# Patient Record
Sex: Female | Born: 1935 | Race: White | Hispanic: No | Smoking: Never smoker
Health system: Southern US, Community
[De-identification: ages and names within clinical notes are randomized; demographics above are authoritative.]

## PROBLEM LIST (undated history)

## (undated) DIAGNOSIS — M1990 Unspecified osteoarthritis, unspecified site: Secondary | ICD-10-CM

## (undated) DIAGNOSIS — F32A Depression, unspecified: Secondary | ICD-10-CM

## (undated) DIAGNOSIS — K529 Noninfective gastroenteritis and colitis, unspecified: Secondary | ICD-10-CM

## (undated) DIAGNOSIS — G894 Chronic pain syndrome: Secondary | ICD-10-CM

## (undated) DIAGNOSIS — M797 Fibromyalgia: Secondary | ICD-10-CM

## (undated) DIAGNOSIS — F0390 Unspecified dementia without behavioral disturbance: Secondary | ICD-10-CM

## (undated) DIAGNOSIS — F329 Major depressive disorder, single episode, unspecified: Secondary | ICD-10-CM

## (undated) DIAGNOSIS — I10 Essential (primary) hypertension: Secondary | ICD-10-CM

## (undated) DIAGNOSIS — K222 Esophageal obstruction: Secondary | ICD-10-CM

## (undated) DIAGNOSIS — G8929 Other chronic pain: Secondary | ICD-10-CM

## (undated) DIAGNOSIS — M549 Dorsalgia, unspecified: Secondary | ICD-10-CM

## (undated) DIAGNOSIS — F419 Anxiety disorder, unspecified: Secondary | ICD-10-CM

## (undated) DIAGNOSIS — M810 Age-related osteoporosis without current pathological fracture: Secondary | ICD-10-CM

## (undated) DIAGNOSIS — J189 Pneumonia, unspecified organism: Secondary | ICD-10-CM

## (undated) DIAGNOSIS — N390 Urinary tract infection, site not specified: Secondary | ICD-10-CM

## (undated) DIAGNOSIS — C4490 Unspecified malignant neoplasm of skin, unspecified: Secondary | ICD-10-CM

## (undated) HISTORY — PX: FRACTURE SURGERY: SHX138

## (undated) HISTORY — PX: CHOLECYSTECTOMY: SHX55

## (undated) HISTORY — PX: POSTERIOR LUMBAR FUSION: SHX6036

## (undated) HISTORY — DX: Major depressive disorder, single episode, unspecified: F32.9

## (undated) HISTORY — PX: BACK SURGERY: SHX140

## (undated) HISTORY — DX: Noninfective gastroenteritis and colitis, unspecified: K52.9

## (undated) HISTORY — PX: ESOPHAGOGASTRODUODENOSCOPY (EGD) WITH ESOPHAGEAL DILATION: SHX5812

## (undated) HISTORY — PX: APPENDECTOMY: SHX54

## (undated) HISTORY — PX: SKIN CANCER EXCISION: SHX779

## (undated) HISTORY — PX: VEIN LIGATION AND STRIPPING: SHX2653

## (undated) HISTORY — DX: Chronic pain syndrome: G89.4

## (undated) HISTORY — PX: ABDOMINAL HYSTERECTOMY: SHX81

## (undated) HISTORY — PX: CATARACT EXTRACTION W/ INTRAOCULAR LENS  IMPLANT, BILATERAL: SHX1307

## (undated) HISTORY — DX: Depression, unspecified: F32.A

## (undated) HISTORY — PX: COLONOSCOPY: SHX174

## (undated) HISTORY — PX: TUBAL LIGATION: SHX77

---

## 1998-10-11 ENCOUNTER — Ambulatory Visit (HOSPITAL_COMMUNITY): Admission: RE | Admit: 1998-10-11 | Discharge: 1998-10-11 | Payer: Self-pay | Admitting: Family Medicine

## 1998-10-11 ENCOUNTER — Encounter: Payer: Self-pay | Admitting: Family Medicine

## 1998-11-19 ENCOUNTER — Encounter: Payer: Self-pay | Admitting: Emergency Medicine

## 1998-11-19 ENCOUNTER — Inpatient Hospital Stay (HOSPITAL_COMMUNITY): Admission: EM | Admit: 1998-11-19 | Discharge: 1998-11-21 | Payer: Self-pay | Admitting: Emergency Medicine

## 1998-11-26 ENCOUNTER — Encounter: Payer: Self-pay | Admitting: Cardiology

## 1998-11-26 ENCOUNTER — Ambulatory Visit (HOSPITAL_COMMUNITY): Admission: RE | Admit: 1998-11-26 | Discharge: 1998-11-26 | Payer: Self-pay | Admitting: Cardiology

## 2000-07-30 ENCOUNTER — Ambulatory Visit (HOSPITAL_COMMUNITY): Admission: RE | Admit: 2000-07-30 | Discharge: 2000-07-30 | Payer: Self-pay | Admitting: Family Medicine

## 2000-07-30 ENCOUNTER — Encounter: Payer: Self-pay | Admitting: Family Medicine

## 2000-11-12 ENCOUNTER — Inpatient Hospital Stay
Admission: RE | Admit: 2000-11-12 | Discharge: 2000-11-18 | Payer: Self-pay | Admitting: Physical Medicine & Rehabilitation

## 2000-12-14 ENCOUNTER — Ambulatory Visit (HOSPITAL_COMMUNITY): Admission: RE | Admit: 2000-12-14 | Discharge: 2000-12-14 | Payer: Self-pay | Admitting: Neurosurgery

## 2000-12-14 ENCOUNTER — Encounter: Payer: Self-pay | Admitting: Neurosurgery

## 2001-03-19 ENCOUNTER — Encounter: Payer: Self-pay | Admitting: Neurosurgery

## 2001-03-19 ENCOUNTER — Ambulatory Visit (HOSPITAL_COMMUNITY): Admission: RE | Admit: 2001-03-19 | Discharge: 2001-03-19 | Payer: Self-pay | Admitting: Neurosurgery

## 2001-03-19 ENCOUNTER — Encounter: Admission: RE | Admit: 2001-03-19 | Discharge: 2001-03-19 | Payer: Self-pay | Admitting: Family Medicine

## 2001-03-19 ENCOUNTER — Encounter: Payer: Self-pay | Admitting: Family Medicine

## 2003-10-31 ENCOUNTER — Ambulatory Visit (HOSPITAL_COMMUNITY): Admission: RE | Admit: 2003-10-31 | Discharge: 2003-10-31 | Payer: Self-pay | Admitting: Neurosurgery

## 2003-11-02 ENCOUNTER — Encounter: Admission: RE | Admit: 2003-11-02 | Discharge: 2003-11-02 | Payer: Self-pay | Admitting: Neurosurgery

## 2003-12-26 ENCOUNTER — Ambulatory Visit (HOSPITAL_COMMUNITY): Admission: RE | Admit: 2003-12-26 | Discharge: 2003-12-26 | Payer: Self-pay | Admitting: Orthopedic Surgery

## 2004-03-12 ENCOUNTER — Inpatient Hospital Stay (HOSPITAL_COMMUNITY): Admission: RE | Admit: 2004-03-12 | Discharge: 2004-03-16 | Payer: Self-pay | Admitting: Orthopedic Surgery

## 2004-03-12 HISTORY — PX: HEMIARTHROPLASTY SHOULDER FRACTURE: SUR653

## 2004-03-28 ENCOUNTER — Emergency Department (HOSPITAL_COMMUNITY): Admission: EM | Admit: 2004-03-28 | Discharge: 2004-03-29 | Payer: Self-pay | Admitting: Emergency Medicine

## 2004-04-05 ENCOUNTER — Observation Stay (HOSPITAL_COMMUNITY): Admission: AD | Admit: 2004-04-05 | Discharge: 2004-04-07 | Payer: Self-pay | Admitting: Gastroenterology

## 2004-08-14 ENCOUNTER — Encounter (INDEPENDENT_AMBULATORY_CARE_PROVIDER_SITE_OTHER): Payer: Self-pay | Admitting: Specialist

## 2004-08-14 ENCOUNTER — Observation Stay (HOSPITAL_COMMUNITY): Admission: RE | Admit: 2004-08-14 | Discharge: 2004-08-15 | Payer: Self-pay | Admitting: General Surgery

## 2005-06-25 ENCOUNTER — Ambulatory Visit (HOSPITAL_COMMUNITY): Admission: RE | Admit: 2005-06-25 | Discharge: 2005-06-25 | Payer: Self-pay | Admitting: Gastroenterology

## 2007-11-30 HISTORY — PX: CLOSED REDUCTION NASAL FRACTURE: SUR256

## 2007-12-06 ENCOUNTER — Emergency Department (HOSPITAL_COMMUNITY): Admission: EM | Admit: 2007-12-06 | Discharge: 2007-12-06 | Payer: Self-pay | Admitting: Emergency Medicine

## 2007-12-14 ENCOUNTER — Encounter: Admission: RE | Admit: 2007-12-14 | Discharge: 2007-12-14 | Payer: Self-pay | Admitting: Otolaryngology

## 2007-12-16 ENCOUNTER — Ambulatory Visit (HOSPITAL_BASED_OUTPATIENT_CLINIC_OR_DEPARTMENT_OTHER): Admission: RE | Admit: 2007-12-16 | Discharge: 2007-12-16 | Payer: Self-pay | Admitting: Otolaryngology

## 2008-09-06 ENCOUNTER — Ambulatory Visit: Payer: Self-pay | Admitting: Vascular Surgery

## 2008-10-18 ENCOUNTER — Encounter: Admission: RE | Admit: 2008-10-18 | Discharge: 2008-10-18 | Payer: Self-pay | Admitting: Family Medicine

## 2009-03-29 ENCOUNTER — Ambulatory Visit (HOSPITAL_COMMUNITY): Admission: RE | Admit: 2009-03-29 | Discharge: 2009-03-29 | Payer: Self-pay | Admitting: Gastroenterology

## 2009-11-14 ENCOUNTER — Ambulatory Visit: Payer: Self-pay | Admitting: Cardiology

## 2009-11-19 ENCOUNTER — Telehealth (INDEPENDENT_AMBULATORY_CARE_PROVIDER_SITE_OTHER): Payer: Self-pay | Admitting: *Deleted

## 2009-11-20 ENCOUNTER — Encounter: Payer: Self-pay | Admitting: Cardiovascular Disease

## 2009-11-20 ENCOUNTER — Encounter (HOSPITAL_COMMUNITY): Admission: RE | Admit: 2009-11-20 | Discharge: 2009-12-18 | Payer: Self-pay | Admitting: Cardiology

## 2009-11-20 ENCOUNTER — Encounter (INDEPENDENT_AMBULATORY_CARE_PROVIDER_SITE_OTHER): Payer: Self-pay | Admitting: *Deleted

## 2009-11-20 ENCOUNTER — Ambulatory Visit: Payer: Self-pay

## 2009-11-20 ENCOUNTER — Ambulatory Visit: Payer: Self-pay | Admitting: Cardiovascular Disease

## 2009-11-20 HISTORY — PX: CARDIOVASCULAR STRESS TEST: SHX262

## 2009-11-28 ENCOUNTER — Ambulatory Visit: Payer: Self-pay | Admitting: Cardiology

## 2010-05-02 NOTE — Letter (Signed)
Summary: Outpatient Coinsurance Notice  Outpatient Coinsurance Notice   Imported By: Marylou Mccoy 11/30/2009 09:41:54  _____________________________________________________________________  External Attachment:    Type:   Image     Comment:   External Document

## 2010-05-02 NOTE — Assessment & Plan Note (Signed)
Summary: Cardiology Nuclear Testing  Nuclear Med Background Indications for Stress Test: Evaluation for Ischemia   History: GXT  History Comments: NO DOCUMENTED CAD  Symptoms: Chest Pressure, Diaphoresis, Dizziness, DOE, Fatigue, Near Syncope, SOB  Symptoms Comments: c/o neck pressure. Last episode of CP:1 week ago.   Nuclear Pre-Procedure Cardiac Risk Factors: Family History - CAD, Hypertension, Overweight Caffeine/Decaff Intake: none NPO After: 10:00 PM Lungs: Clear.  O2 Sat 98% on RA. IV 0.9% NS with Angio Cath: 22g     IV Site: R Hand IV Started by: Cathlyn Parsons, RN Cup Size 42C     Height (in): 65 Weight (lb): 174 BMI: 29.06  Nuclear Med Study 1 or 2 day study:  1 day     Stress Test Type:  Treadmill/Lexiscan Reading MD:  Charlton Haws, MD     Referring MD:  Roger Shelter, MD Resting Radionuclide:  Technetium 22m Tetrofosmin     Resting Radionuclide Dose:  10.7 mCi  Stress Radionuclide:  Technetium 28m Tetrofosmin     Stress Radionuclide Dose:  33.0 mCi   Stress Protocol Exercise Time (min):  2:00 min     Max HR:  101 bpm     Predicted Max HR:  143 bpm       Percent Max HR:  70.63 % Lexiscan: 0.4 mg   Stress Test Technologist:  [employer], CMA-N     Nuclear Technologist:  Domenic Polite, CNMT  Rest Procedure  Myocardial perfusion imaging was performed at rest 45 minutes following the intravenous administration of Technetium 51m Tetrofosmin.  Stress Procedure  The patient received IV Lexiscan 0.4 mg over 15-seconds with concurrent low level exercise and then technetium 39m Tetrofosmin was injected at 30-seconds while  the patient continued walking one more minute.  There were no significant changes with infusion.  She did c/o chest pressure with infusion.  Quantitative spect images were obtained after a 45 minute delay.  QPS Raw Data Images:  Normal; no motion artifact; normal heart/lung ratio. Stress Images:  Normal homogeneous uptake in all areas of the  myocardium. Rest Images:  Normal homogeneous uptake in all areas of the myocardium. Subtraction (SDS):  Normal Transient Ischemic Dilatation:  0.94  (Normal <1.22)  Lung/Heart Ratio:  0.38  (Normal <0.45)  Quantitative Gated Spect Images QGS EDV:  41 ml QGS ESV:  10 ml QGS EF:  76 % QGS cine images:  normal  Findings Normal nuclear study      Overall Impression  Exercise Capacity: Lexiscan with no exercise. BP Response: Normal blood pressure response. Clinical Symptoms: Dyspnea ECG Impression: No significant ST segment change suggestive of ischemia. Overall Impression: Normal stress nuclear study. Overall Impression Comments: Normal

## 2010-05-02 NOTE — Progress Notes (Signed)
Summary: nuclear pre procdure  Phone Note Outgoing Call Call back at [facility] Phone [phone]   Call placed by: Cathlyn Parsons RN,  November 19, 2009 3:54 PM Call placed to: Patient Summary of Call: Reviewed information on My[employer] Information Sheet (see scanned document for further details).  Spoke with pt.      Nuclear Med Background Indications for Stress Test: Evaluation for Ischemia     Symptoms: Chest Pressure, Diaphoresis, Dizziness

## 2010-08-13 NOTE — Op Note (Signed)
Alicia Roth, MILLION                ACCOUNT NO.:  [id number]   MEDICAL RECORD NO.:  [id number]          PATIENT TYPE:  AMB   LOCATION:  DSC                          FACILITY:  [facility]   PHYSICIAN:  Antony Contras, MD     DATE OF BIRTH:  12-15-1930   DATE OF PROCEDURE:  12/16/2007  DATE OF DISCHARGE:                               OPERATIVE REPORT   PREOPERATIVE DIAGNOSIS:  Displaced nasal fracture.   POSTOPERATIVE DIAGNOSIS:  Displaced nasal fracture.   PROCEDURE:  Closed nasal reduction.   SURGEON:  Excell Seltzer. Jenne Pane, MD.   ANESTHESIA:  General LMA.   COMPLICATIONS:  None.   INDICATION:  The patient is a 77 year old white female who fell about 10  days ago, striking her face on the pavement.  She sustained nasal  fractures with the right side being displaced with a step-off palpated.  She presents to the operating room for surgical management.   FINDINGS:  As above.   DESCRIPTION OF THE PROCEDURE:  The patient was identified in the holding  room and informed consent having been obtained including discussion of  risks, benefits, and alternatives, the patient was brought to the  operative suite and put on the operative table in supine position.  Anesthesia was induced and LMA was placed without difficulty.  The  patient was given intravenous steroids during the case.  The eyes were  taped closed and Afrin pledgets were placed in both sides of the nose  for several minutes.  Pledgets removed and a Goldman elevator was then  inserted first in the right nasal passage and then into the left with  bimanual manipulation to reduce the fracture.  With fractures in line in  the nose in the midline position, Benzoin was placed in the outer skin  followed by Steri-Strips.  A thermoplastic splint was then put into hot  water and made malleable and laid over the nose until it hardened.  The  nasal passages were suctioned.  The patient was returned back to  anesthesia for wake up.  She was  extubated and moved to the recovery  room in stable condition.      Antony Contras, MD  Electronically Signed     DDB/MEDQ  D:  12/16/2007  T:  12/17/2007  Job:  [id number]

## 2010-08-13 NOTE — Procedures (Signed)
LOWER EXTREMITY VENOUS REFLUX EXAM   INDICATION:  Bilateral lower extremity varicose vein with pain.  Right  lower extremity vein stripping in 1960s.   EXAM:  Using color-flow imaging and pulse Doppler spectral analysis, the  right and left common femoral, superficial femoral, popliteal, posterior  tibial, greater and lesser saphenous veins are evaluated.  There is  evidence suggesting deep venous insufficiency in the left lower  extremity, the right is within normal limits.   The right and left saphenofemoral junctions are competent.  The left GSV  is not competent with the caliber as described below.  The right GSV  appears to have been stripped, however, large tortuous vein from  proximal/mid thigh to distal calf was noted.   The right and left proximal short saphenous veins demonstrates  competency.   GSV Diameter (used if found to be incompetent only)                                            Right    Left  Proximal Greater Saphenous Vein           cm       0.41 cm  Proximal-to-mid-thigh                     cm       cm  Mid thigh                                 cm       0.46 cm  Mid-distal thigh                          cm       cm  Distal thigh                              cm       0.38 cm  Knee                                      cm       0.41 cm   IMPRESSION:  1. Left greater saphenous vein reflux is identified with the caliber      ranging from 0.38 cm to 0.46 cm knee to groin.  2. The right GSV appears to have been stripped, however, large      tortuous vein from proximal/mid thigh to distal calf was noted.  3. The left greater saphenous vein is not aneurysmal.  4. The left greater saphenous vein is not tortuous.  5. The deep venous system is not competent on the left, the right is      within normal limits.  6. The right and left lesser saphenous veins are competent.       ___________________________________________  Larina Earthly, M.D.   AS/MEDQ  D:  09/06/2008  T:  09/06/2008  Job:  [id number]

## 2010-08-13 NOTE — Consult Note (Signed)
NEW PATIENT CONSULTATION   Alicia Roth, Alicia Roth  DOB:  01/05/1931                                       09/06/2008  CHART#:[id number]   The patient presents today for evaluation of venous pathology.  She is a  77 year old today.  She has multiple complaints and reports that she has  pain from the top of her head to the bottoms of her feet.  She reports  that she has chronic pain in her lower extremities, mainly extending  from her lower back down into both legs, more so on the left leg than on  the right leg.  She does not have any history of DVT.  Does have a  history of right great saphenous vein stripping in 1964.  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  She has had multiple difficulties with arthritis, and has had  multiple prior back surgeries.  She is seen today to determine if this  is any correctable pathology in her legs and may improve her pain.  She  does not have any major medical difficulties, specifically cardiac  issues or peripheral vascular disease.  She does have a history of  premature atherosclerotic disease in her mother.   SOCIAL HISTORY:  She is widowed with 4 children.  She is retired.  She  does not smoke, or drink alcohol.   REVIEW OF SYSTEMS:  Her weight is reported at 183 pounds.  She is 5 feet  4 inches tall.  She does have esophageal reflux and history of  dizziness, headaches, arthritis, joint pain, muscle pain.  She does have  a sensitivity to Indocin and Neurontin.   PHYSICAL EXAM:  Well-developed, well-nourished white female appearing  stated age of 31.  Her radial and dorsalis pulses are 2+ bilaterally.  She does have a significant amount of tributary varicosities in her  medial left thigh extending down onto her calf.  She does not have any  venous ulcers or changes with venous hypertension.  Her left leg is  noted for a few scattered telangiectasia and small varicosities.   She  underwent noninvasive vascular laboratory studies in our office and  this revealed reflux throughout her left greater saphenous vein.  She  does not have any evidence of DVT or deep venous incompetence.  On the  right leg, her saphenous vein is surgically absent.  She does have  multiple tributary varicosities throughout her left thigh and calf.  I  discussed this at length with the patient and her daughter and daughter-  in-law present.  I do not feel that she has any symptoms of any degree  that is related to her left leg, since the majority of her pain appears  to be neuropathic.  She does have some fullness in the tributary  varicosities in her right leg, but again, I feel these are a small  component of her discomfort.  I did explain the option of a stab  phlebectomy in the tributary varicosities in her right leg for relief of  symptoms.  She understands this and agrees that this is a small  component of her discomfort and does not wish to proceed with aggressive  treatment at this time.  I did explain the possible use of compression  garments, but feel it would be very unlikely  for her to do this due to  her other comorbidities with arthritis and she has worn compression in  the past without success.  She understands and will see [place] again on an  as needed basis.   Larina Earthly, Roth.D.  Electronically Signed   TFE/MEDQ  D:  09/06/2008  T:  09/06/2008  Job:  [id number]   cc:   G. Dorene Grebe, Roth.D.  Windle Guard, Roth.D.

## 2010-08-16 NOTE — Discharge Summary (Signed)
NAMEJESSICAMARIE, AMIRI                ACCOUNT NO.:  [id number]   MEDICAL RECORD NO.:  [id number]          PATIENT TYPE:  INP   LOCATION:  5041                         FACILITY:  [facility]   PHYSICIAN:  Burnard Bunting, M.D.    DATE OF BIRTH:  1931-03-06   DATE OF ADMISSION:  03/12/2004  DATE OF DISCHARGE:  03/16/2004                                 DISCHARGE SUMMARY   DISCHARGE DIAGNOSIS:  Right shoulder arthritis.   SECONDARY DIAGNOSES:  History of lumbar surgery and vein surgery, as well as  tubal ligation.   OPERATIONS AND CODEABLE PROCEDURES:  Right shoulder hemiarthroplasty  performed on March 12, 2004.   HOSPITAL COURSE:  Neka Bise is a 77 year old patient with right shoulder  arthritis.  She presents for right shoulder hemiarthroplasty.  This was  performed on March 12, 2004, without complications.  On postoperative day  #1, the patient was noted to have intact perfusion of the right hand with  intact EPL, FPL, interosseous and radial pulse.  She was mobilized out of  bed to chair.  X-rays show good fill of the canal with the prosthesis.  The  patient was started with pendulum exercises on postoperative day #3.  She  otherwise had an uneventful recovery.  UA was negative on the day of  discharge.   DISPOSITION:  The patient was discharged home on March 16, 2004, in good  condition.   FOLLOWUP:  She will follow up with me in a week for staple removal.   ACTIVITY:  She will continue in a sling until that time with no lifting of  the right arm.   WOUND CARE:  She will need to keep the incision dry.   DISCHARGE MEDICATIONS:  Discharge medications include the admission  medications plus Darvocet for pain and Robaxin as a muscle relaxer.      GSD/MEDQ  D:  05/22/2004  T:  05/22/2004  Job:  [id number]

## 2010-08-16 NOTE — H&P (Signed)
[name], Roth                ACCOUNT NO.:  [id number]   MEDICAL RECORD NO.:  [id number]          PATIENT TYPE:  AMB   LOCATION:  DAY                          FACILITY:  [facility]   PHYSICIAN:  Anselm Pancoast. Weatherly, M.D.DATE OF BIRTH:  Jul 05, 1930   DATE OF ADMISSION:  08/14/2004  DATE OF DISCHARGE:                                HISTORY & PHYSICAL   CHIEF COMPLAINT:  Gallbladder problem.   HISTORY OF PRESENT ILLNESS:  Alicia Roth is a 77 year old female that I saw  approximately two months ago when she was referred by Dr. Windle Guard for  nausea and the following history.  The patient was being evaluated for  basically weight loss and other type symptoms.  Her husband had died of  colon cancer about 6-7 years ago and she has never had much of an appetite  and avoids food.  Then had a shoulder operation and afterwards developed  problems with diarrhea and she was rehospitalized because of diarrhea and  continued weight loss.  She never was confirmed to have C. difficile colitis  but her symptoms improved.  She had some mildly elevated liver tests at one  time and on evaluation Dr. Evette Cristal and Dr. Windle Guard were her regular  physician and she was found to have gallstones but whether she was actually  having an acute gallbladder at any time was never thought to be the case.  She had lost a significant amount of weight but she has gained that back.  She is now having episodes of nausea and for this reason was referred to me  for a cholecystectomy.  The patient's weight loss that had been lost has  been regained and her studies now as far as her liver tests and CBC are all  normal.  I have recommended proceeding with a laparoscopic cholecystectomy  and cholangiogram and the patient is here for this planned procedure.   PAST MEDICAL HISTORY:  She definitely has a history of depression for which  she is on Zoloft.   PAST SURGICAL HISTORY:  She had the shoulder operation. She has had  a GYN  hysterectomy.   REVIEW OF SYSTEMS:  As far as on her review of systems the patient does live  alone.  She has two children that frequently assist in her care.  She really  denies anything basically on her review of systems with the exception that  she just says she does not like to eat and I think part of this is from  eating alone.  When I first saw her and talked with Dr. Jeannetta Nap there a was  question of whether she was continuing to lose weight but that has turned  around and we did not proceed on with a CT looking at the pancreas, etc.,  since her liver test had returned back to normal and her appetite has  improved.   PHYSICAL EXAMINATION:  GENERAL:  She is a pleasant lady, somewhat reserved  in no acute distress at this time.  VITAL SIGNS:  Temperature 98.6, pulse 82 and regular, respirations 20, blood  pressure 140/90.  HEENT:  She has dentures.  Appears adequately hydrated.  No cervical or  supraclavicular lymphadenopathy.  Good breath sounds bilaterally.  CARDIAC:  Normal sinus rhythm.  BREASTS:  Negative.  ABDOMEN:  She is not acutely tender.  She said on a couple of occasions she  has had some localized right upper quadrant pain.  She has got a well-healed  midline incision.  She has had a colonoscopy and no blood in the stools,  etc.  EXTREMITIES:  Unremarkable.   IMPRESSION:  Symptomatic gallstones.   PLAN:  Laparoscopic cholecystectomy and cholangiogram.      WJW/MEDQ  D:  08/14/2004  T:  08/14/2004  Job:  [id number]

## 2010-10-16 ENCOUNTER — Other Ambulatory Visit: Payer: Self-pay | Admitting: Family Medicine

## 2010-10-16 DIAGNOSIS — I739 Peripheral vascular disease, unspecified: Secondary | ICD-10-CM

## 2010-10-17 ENCOUNTER — Other Ambulatory Visit: Payer: Self-pay | Admitting: Family Medicine

## 2010-10-17 ENCOUNTER — Ambulatory Visit
Admission: RE | Admit: 2010-10-17 | Discharge: 2010-10-17 | Disposition: A | Discharge status: Home / Self Care | Payer: Medicare Other | Source: Ambulatory Visit | Attending: Family Medicine | Admitting: Family Medicine

## 2010-10-17 DIAGNOSIS — I739 Peripheral vascular disease, unspecified: Secondary | ICD-10-CM

## 2010-12-30 LAB — POCT HEMOGLOBIN-HEMACUE: Hemoglobin: 12.9

## 2011-08-05 ENCOUNTER — Encounter: Payer: Self-pay | Admitting: *Deleted

## 2011-09-01 ENCOUNTER — Other Ambulatory Visit: Payer: Self-pay | Admitting: Dermatology

## 2013-09-16 ENCOUNTER — Emergency Department (HOSPITAL_COMMUNITY): Payer: Medicare Other

## 2013-09-16 ENCOUNTER — Emergency Department (HOSPITAL_COMMUNITY)
Admission: EM | Admit: 2013-09-16 | Discharge: 2013-09-16 | Disposition: A | Discharge status: Home / Self Care | Payer: Medicare Other | Attending: Emergency Medicine | Admitting: Emergency Medicine

## 2013-09-16 ENCOUNTER — Encounter (HOSPITAL_COMMUNITY): Payer: Self-pay | Admitting: Emergency Medicine

## 2013-09-16 DIAGNOSIS — S0180XA Unspecified open wound of other part of head, initial encounter: Secondary | ICD-10-CM | POA: Insufficient documentation

## 2013-09-16 DIAGNOSIS — W19XXXA Unspecified fall, initial encounter: Secondary | ICD-10-CM

## 2013-09-16 DIAGNOSIS — S42413A Displaced simple supracondylar fracture without intercondylar fracture of unspecified humerus, initial encounter for closed fracture: Secondary | ICD-10-CM | POA: Insufficient documentation

## 2013-09-16 DIAGNOSIS — Y92009 Unspecified place in unspecified non-institutional (private) residence as the place of occurrence of the external cause: Secondary | ICD-10-CM | POA: Insufficient documentation

## 2013-09-16 DIAGNOSIS — S42212A Unspecified displaced fracture of surgical neck of left humerus, initial encounter for closed fracture: Secondary | ICD-10-CM

## 2013-09-16 DIAGNOSIS — W1809XA Striking against other object with subsequent fall, initial encounter: Secondary | ICD-10-CM | POA: Insufficient documentation

## 2013-09-16 DIAGNOSIS — Y9389 Activity, other specified: Secondary | ICD-10-CM | POA: Insufficient documentation

## 2013-09-16 DIAGNOSIS — Z8719 Personal history of other diseases of the digestive system: Secondary | ICD-10-CM | POA: Insufficient documentation

## 2013-09-16 DIAGNOSIS — F3289 Other specified depressive episodes: Secondary | ICD-10-CM | POA: Insufficient documentation

## 2013-09-16 DIAGNOSIS — G894 Chronic pain syndrome: Secondary | ICD-10-CM | POA: Insufficient documentation

## 2013-09-16 DIAGNOSIS — F329 Major depressive disorder, single episode, unspecified: Secondary | ICD-10-CM | POA: Insufficient documentation

## 2013-09-16 DIAGNOSIS — S42412A Displaced simple supracondylar fracture without intercondylar fracture of left humerus, initial encounter for closed fracture: Secondary | ICD-10-CM

## 2013-09-16 DIAGNOSIS — S42213A Unspecified displaced fracture of surgical neck of unspecified humerus, initial encounter for closed fracture: Secondary | ICD-10-CM | POA: Insufficient documentation

## 2013-09-16 DIAGNOSIS — S0181XA Laceration without foreign body of other part of head, initial encounter: Secondary | ICD-10-CM

## 2013-09-16 LAB — I-STAT CG4 LACTIC ACID, ED: LACTIC ACID, VENOUS: 1.07 mmol/L (ref 0.5–2.2)

## 2013-09-16 LAB — BASIC METABOLIC PANEL
BUN: 26 mg/dL — ABNORMAL HIGH (ref 6–23)
CHLORIDE: 103 meq/L (ref 96–112)
CO2: 23 mEq/L (ref 19–32)
CREATININE: 0.86 mg/dL (ref 0.50–1.10)
Calcium: 9.4 mg/dL (ref 8.4–10.5)
GFR calc Af Amer: 71 mL/min — ABNORMAL LOW (ref >90)
GFR calc non Af Amer: 62 mL/min — ABNORMAL LOW (ref >90)
Glucose, Bld: 99 mg/dL (ref 70–99)
Potassium: 4.3 mEq/L (ref 3.7–5.3)
Sodium: 139 mEq/L (ref 137–147)

## 2013-09-16 LAB — CBC
HEMATOCRIT: 35.2 % — AB (ref 36.0–46.0)
Hemoglobin: 11.7 g/dL — ABNORMAL LOW (ref 12.0–15.0)
MCH: 29.3 pg (ref 26.0–34.0)
MCHC: 33.2 g/dL (ref 30.0–36.0)
MCV: 88.2 fL (ref 78.0–100.0)
PLATELETS: 236 10*3/uL (ref 150–400)
RBC: 3.99 MIL/uL (ref 3.87–5.11)
RDW: 13.1 % (ref 11.5–15.5)
WBC: 12.1 10*3/uL — AB (ref 4.0–10.5)

## 2013-09-16 MED ORDER — HYDROCODONE-ACETAMINOPHEN 5-325 MG PO TABS
2.0000 | ORAL_TABLET | Freq: Once | ORAL | Status: AC
  Administered 2013-09-16: 2 via ORAL
  Filled 2013-09-16: qty 2

## 2013-09-16 MED ORDER — FENTANYL CITRATE 0.05 MG/ML IJ SOLN
50.0000 ug | Freq: Once | INTRAMUSCULAR | Status: AC
  Administered 2013-09-16: 50 ug via INTRAVENOUS
  Filled 2013-09-16: qty 2

## 2013-09-16 MED ORDER — HYDROCODONE-ACETAMINOPHEN 5-325 MG PO TABS: 1.0000 | ORAL_TABLET | Freq: Four times a day (QID) | ORAL | Status: DC | PRN

## 2013-09-16 NOTE — Progress Notes (Signed)
  CARE MANAGEMENT ED NOTE 09/16/2013  Patient:  FRANCE, LUSTY   Account Number:  [id number]  Date Initiated:  09/16/2013  Documentation initiated by:  Jackelyn Poling  Subjective/Objective Assessment:   80 yr old [employer] medicare complete [place] [employer]) pt with no pcp listed from home, tripped & fell & hit her head on a plastic trash can.1 1/2 inch laceration by L eyebrow, swelling to L elbow.PMH arthritis in L shoulder     Subjective/Objective Assessment Detail:   Pt noted with confusion Son and Female at bedside Pt noted with Blood in hair on right side of head Pt grimacing with pain when she is touching left arm & hand ED Rn present in room offering iv pain medication Pt spoke about various doctors but female confirmed pcp is Dr Arelia Sneddon and ortho Izayiah Tibbitts is Dr Inda Merlin Pt able to tell Cm her name is "Mylene" and able to tell Cm her son "is my baby"     Action/Plan:   Cm spoke with pt & family in room updated EPIC   Action/Plan Detail:   Anticipated DC Date:       Status Recommendation to Physician:   Result of Recommendation:    Other ED Vin[place]  Other  PCP issues  Outpatient Services - Pt will follow up    Choice offered to / List presented to:            Status of service:  Completed, signed off  ED Comments:   ED Comments Detail:

## 2013-09-16 NOTE — Discharge Instructions (Signed)
Facial Laceration ° A facial laceration is a cut on the face. These injuries can be painful and cause bleeding. Lacerations usually heal quickly, but they need special care to reduce scarring. °DIAGNOSIS  °Your health care provider will take a medical history, ask for details about how the injury occurred, and examine the wound to determine how deep the cut is. °TREATMENT  °Some facial lacerations may not require closure. Others may not be able to be closed because of an increased risk of infection. The risk of infection and the chance for successful closure will depend on various factors, including the amount of time since the injury occurred. °The wound may be cleaned to help prevent infection. If closure is appropriate, pain medicines may be given if needed. Your health care provider will use stitches (sutures), wound glue (adhesive), or skin adhesive strips to repair the laceration. These tools bring the skin edges together to allow for faster healing and a better cosmetic outcome. If needed, you may also be given a tetanus shot. °HOME CARE INSTRUCTIONS °· Only take over-the-counter or prescription medicines as directed by your health care provider. °· Follow your health care provider's instructions for wound care. These instructions will vary depending on the technique used for closing the wound. °For Sutures: °· Keep the wound clean and dry.   °· If you were given a bandage (dressing), you should change it at least once a day. Also change the dressing if it becomes wet or dirty, or as directed by your health care provider.   °· Wash the wound with soap and water 2 times a day. Rinse the wound off with water to remove all soap. Pat the wound dry with a clean towel.   °· After cleaning, apply a thin layer of the antibiotic ointment recommended by your health care provider. This will help prevent infection and keep the dressing from sticking.   °· You may shower as usual after the first 24 hours. Do not soak the  wound in water until the sutures are removed.   °· Get your sutures removed as directed by your health care provider. With facial lacerations, sutures should usually be taken out after 4-5 days to avoid stitch marks.   °· Wait a few days after your sutures are removed before applying any makeup. °For Skin Adhesive Strips: °· Keep the wound clean and dry.   °· Do not get the skin adhesive strips wet. You may bathe carefully, using caution to keep the wound dry.   °· If the wound gets wet, pat it dry with a clean towel.   °· Skin adhesive strips will fall off on their own. You may trim the strips as the wound heals. Do not remove skin adhesive strips that are still stuck to the wound. They will fall off in time.   °For Wound Adhesive: °· You may briefly wet your wound in the shower or bath. Do not soak or scrub the wound. Do not swim. Avoid periods of heavy sweating until the skin adhesive has fallen off on its own. After showering or bathing, gently pat the wound dry with a clean towel.   °· Do not apply liquid medicine, cream medicine, ointment medicine, or makeup to your wound while the skin adhesive is in place. This may loosen the film before your wound is healed.   °· If a dressing is placed over the wound, be careful not to apply tape directly over the skin adhesive. This may cause the adhesive to be pulled off before the wound is healed.   °· Avoid   prolonged exposure to sunlight or tanning lamps while the skin adhesive is in place.  The skin adhesive will usually remain in place for 5-10 days, then naturally fall off the skin. Do not pick at the adhesive film.  After Healing: Once the wound has healed, cover the wound with sunscreen during the day for 1 full year. This can help minimize scarring. Exposure to ultraviolet light in the first year will darken the scar. It can take 1-2 years for the scar to lose its redness and to heal completely.  SEEK IMMEDIATE MEDICAL CARE IF:  You have redness, pain, or  swelling around the wound.   You see ayellowish-white fluid (pus) coming from the wound.   You have chills or a fever.  MAKE SURE YOU:  Understand these instructions.  Will watch your condition.  Will get help right away if you are not doing well or get worse. Document Released: 04/24/2004 Document Revised: 01/05/2013 Document Reviewed: 10/28/2012 [facility] Patient Information 2015 [place], [place]. This information is not intended to replace advice given to you by your health care provider. Make sure you discuss any questions you have with your health care provider.  Humerus Fracture, Treated with Immobilization The humerus is the large bone in your upper arm. You have a broken (fractured) humerus. These fractures are easily diagnosed with X-rays. TREATMENT  Simple fractures which will heal without disability are treated with simple immobilization. Immobilization means you will wear a cast, splint, or sling. You have a fracture which will do well with immobilization. The fracture will heal well simply by being held in a good position until it is stable enough to begin range of motion exercises. Do not take part in activities which would further injure your arm.  HOME CARE INSTRUCTIONS   Put ice on the injured area.  Put ice in a plastic bag.  Place a towel between your skin and the bag.  Leave the ice on for 15-20 minutes, 03-04 times a day.  If you have a cast:  Do not scratch the skin under the cast using sharp or pointed objects.  Check the skin around the cast every day. You may put lotion on any red or sore areas.  Keep your cast dry and clean.  If you have a splint:  Wear the splint as directed.  Keep your splint dry and clean.  You may loosen the elastic around the splint if your fingers become numb, tingle, or turn cold or blue.  If you have a sling:  Wear the sling as directed.  Do not put pressure on any part of your cast or splint until it is fully  hardened.  Your cast or splint can be protected during bathing with a plastic bag. Do not lower the cast or splint into water.  Only take over-the-counter or prescription medicines for pain, discomfort, or fever as directed by your caregiver.  Do range of motion exercises as instructed by your caregiver.  Follow up as directed by your caregiver. This is very important in order to avoid permanent injury or disability and chronic pain. SEEK IMMEDIATE MEDICAL CARE IF:   Your skin or nails in the injured arm turn blue or gray.  Your arm feels cold or numb.  You develop severe pain in the injured arm.  You are having problems with the medicines you were given. MAKE SURE YOU:   Understand these instructions.  Will watch your condition.  Will get help right away if you are not doing  well or get worse. Document Released: 06/23/2000 Document Revised: 06/09/2011 Document Reviewed: 05/01/2010 [facility] Patient Information 2015 [place], [place]. This information is not intended to replace advice given to you by your health care provider. Make sure you discuss any questions you have with your health care provider.

## 2013-09-16 NOTE — ED Notes (Signed)
Ortho called to take care of splint.

## 2013-09-16 NOTE — ED Notes (Signed)
Patient transported to X-ray 

## 2013-09-16 NOTE — ED Notes (Signed)
Bed: WA20 Expected date:  Expected time:  Means of arrival:  Comments: EMS-fall-laceration

## 2013-09-16 NOTE — ED Provider Notes (Signed)
CSN: [id number]     Arrival date & time 09/16/13  1723 History   First MD Initiated Contact with Patient 09/16/13 1726     Chief Complaint  Patient presents with  . Fall  . Facial Laceration     (Consider location/radiation/quality/duration/timing/severity/associated sxs/prior Treatment) Patient is a 80 y.o. female presenting with fall. The history is provided by the patient.  Fall This is a new problem. The current episode started less than 1 hour ago. Episode frequency: once. The problem has not changed since onset.Pertinent negatives include no chest pain, no abdominal pain and no shortness of breath. Nothing aggravates the symptoms. Nothing relieves the symptoms.    Past Medical History  Diagnosis Date  . Colitis   . Depression   . S/P dilatation of esophageal stricture   . Chronic pain syndrome    Past Surgical History  Procedure Laterality Date  . Back surgery      x3  . Cholecystectomy    . Shoulder surgery    . Vein ligation and stripping    . Appendectomy    . Cardiovascular stress test  11/20/2009    ef 76%, no ischemia   Family History  Problem Relation Age of Onset  . Heart failure Sister   . Rheumatic fever Brother    History  Substance Use Topics  . Smoking status: Never Smoker   . Smokeless tobacco: Not on file  . Alcohol Use: No   OB History   Grav Para Term Preterm Abortions TAB SAB Ect Mult Living                 Review of Systems  Constitutional: Negative for fever and chills.  Respiratory: Negative for cough and shortness of breath.   Cardiovascular: Negative for chest pain and leg swelling.  Gastrointestinal: Negative for vomiting and abdominal pain.  All other systems reviewed and are negative.     Allergies  Codeine; Diazepam; Gabapentin; and Indomethacin  Home Medications   Prior to Admission medications   Medication Sig Start Date End Date Taking? Authorizing Provider  LISINOPRIL PO Take by mouth.    Historical Provider, MD   traMADol (ULTRAM) 50 MG tablet Take 50 mg by mouth every 6 (six) hours as needed.    Historical Provider, MD   BP 153/100  Pulse 84  Temp(Src) 98 F (36.7 C) (Oral)  Resp 20  SpO2 98% Physical Exam  Nursing note and vitals reviewed. Constitutional: She is oriented to person, place, and time. She appears well-developed and well-nourished. No distress.  HENT:  Head: Normocephalic and atraumatic.  Mouth/Throat: Oropharynx is clear and moist. No oropharyngeal exudate.  Eyes: EOM are normal. Pupils are equal, round, and reactive to light.  Neck: Normal range of motion. Neck supple.  Cardiovascular: Normal rate and regular rhythm.  Exam reveals no friction rub.   No murmur heard. Pulmonary/Chest: Effort normal and breath sounds normal. No respiratory distress. She has no wheezes. She has no rales.  Abdominal: Soft. She exhibits no distension. There is no tenderness. There is no rebound.  Musculoskeletal: Normal range of motion. She exhibits no edema.  Neurological: She is alert and oriented to person, place, and time. No cranial nerve deficit. She exhibits normal muscle tone. Coordination normal.  Skin: No rash noted. She is not diaphoretic.    ED Course  LACERATION REPAIR Date/Time: 09/16/2013 10:48 PM Performed by: Osvaldo Shipper Authorized by: Osvaldo Shipper Consent: Verbal consent obtained. Body area: head/neck Location details: right eyebrow  Laceration length: 4 cm Foreign bodies: no foreign bodies Tendon involvement: none Nerve involvement: none Vascular damage: no Anesthesia: local infiltration Local anesthetic: lidocaine 1% with epinephrine Anesthetic total: 6 ml Patient sedated: no Preparation: Patient was prepped and draped in the usual sterile fashion. Irrigation solution: saline Irrigation method: jet lavage Debridement: none Degree of undermining: none Skin closure: 6-0 Prolene Number of sutures: 7 Technique: simple Approximation:  close Approximation difficulty: simple Patient tolerance: Patient tolerated the procedure well with no immediate complications.   (including critical care time) Labs Review Labs Reviewed - No data to display  Imaging Review Dg Elbow Complete Left  09/16/2013   CLINICAL DATA:  Swelling at distal humerus laterall, decreased ROM, positioning limited secondary to shoulder pain.  EXAM: LEFT ELBOW - COMPLETE 3+ VIEW  COMPARISON:  None.  FINDINGS: An oblique lucency in the supracondylar region appreciated primarily on the PA view. The bones are osteopenic. Mild areas of peripheral hypertrophic spurring.  IMPRESSION: Findings concerning for nondisplaced supracondylar fracture.   Electronically Signed   By: Margaree Mackintosh M.D.   On: 09/16/2013 18:25   Ct Head Wo Contrast  09/16/2013   CLINICAL DATA:  Tripped and fell striking head on plastic trash can, laceration left-sided head, neck pain  EXAM: CT HEAD WITHOUT CONTRAST  CT CERVICAL SPINE WITHOUT CONTRAST  TECHNIQUE: Multidetector CT imaging of the head and cervical spine was performed following the standard protocol without intravenous contrast. Multiplanar CT image reconstructions of the cervical spine were also generated.  COMPARISON:  12/06/2007  FINDINGS: CT HEAD FINDINGS  Generalized atrophy.  Normal ventricular morphology.  No midline shift or mass effect.  Small vessel chronic ischemic changes of deep cerebral Kisa Fujii matter.  Focal low-attenuation and LEFT pons could represent a small lacunar infarct or artifact.  No intracranial hemorrhage, mass lesion, or evidence acute infarction.  No extra-axial fluid collections.  Atherosclerotic calcification of internal carotid and vertebral arteries at skullbase.  Bones demineralized but intact.  Paranasal sinuses and mastoid air cells clear.  CT CERVICAL SPINE FINDINGS  Atherosclerotic calcification of the carotid systems bilaterally.  Lung apices clear.  Bones demineralized.  Multilevel degenerative disc and  facet disease changes of the cervical spine.  Vertebral body heights maintained without fracture or subluxation.  Prevertebral soft tissues normal thickness.  Visualized skullbase intact.  Encroachment upon cervical neural foramina bilaterally by uncovertebral spurs greatest at C3-C4 bilaterally.  IMPRESSION: Atrophy with small vessel chronic ischemic changes of deep cerebral Risa Auman matter.  No acute intracranial abnormalities.  Questionable tiny old lacunar infarct at LEFT pons versus artifact.  Multilevel degenerative disc and facet disease changes of the cervical spine.  No acute cervical spine abnormalities.   Electronically Signed   By: Lavonia Dana M.D.   On: 09/16/2013 19:09   Ct Cervical Spine Wo Contrast  09/16/2013   CLINICAL DATA:  Tripped and fell striking head on plastic trash can, laceration left-sided head, neck pain  EXAM: CT HEAD WITHOUT CONTRAST  CT CERVICAL SPINE WITHOUT CONTRAST  TECHNIQUE: Multidetector CT imaging of the head and cervical spine was performed following the standard protocol without intravenous contrast. Multiplanar CT image reconstructions of the cervical spine were also generated.  COMPARISON:  12/06/2007  FINDINGS: CT HEAD FINDINGS  Generalized atrophy.  Normal ventricular morphology.  No midline shift or mass effect.  Small vessel chronic ischemic changes of deep cerebral Marquavius Scaife matter.  Focal low-attenuation and LEFT pons could represent a small lacunar infarct or artifact.  No intracranial hemorrhage, mass lesion,  or evidence acute infarction.  No extra-axial fluid collections.  Atherosclerotic calcification of internal carotid and vertebral arteries at skullbase.  Bones demineralized but intact.  Paranasal sinuses and mastoid air cells clear.  CT CERVICAL SPINE FINDINGS  Atherosclerotic calcification of the carotid systems bilaterally.  Lung apices clear.  Bones demineralized.  Multilevel degenerative disc and facet disease changes of the cervical spine.  Vertebral body  heights maintained without fracture or subluxation.  Prevertebral soft tissues normal thickness.  Visualized skullbase intact.  Encroachment upon cervical neural foramina bilaterally by uncovertebral spurs greatest at C3-C4 bilaterally.  IMPRESSION: Atrophy with small vessel chronic ischemic changes of deep cerebral Ila Landowski matter.  No acute intracranial abnormalities.  Questionable tiny old lacunar infarct at LEFT pons versus artifact.  Multilevel degenerative disc and facet disease changes of the cervical spine.  No acute cervical spine abnormalities.   Electronically Signed   By: Lavonia Dana M.D.   On: 09/16/2013 19:09   Dg Shoulder Left  09/16/2013   CLINICAL DATA:  Fall.  fall, L shoulder pain, swelling  EXAM: LEFT SHOULDER - 2+ VIEW  COMPARISON:  Chest radiograph 12/14/2007  FINDINGS: There is impaction type fracture within the proximal left humerus at the surgical neck of the humerus. This is age indeterminate but new from 12/14/2007. There is severe arthropathy of the glenohumeral joint on both the humeral side and glenoid side.  IMPRESSION: 1. Age-indeterminate impaction fracture of the proximal left humerus at surgical neck. 2. Severe arthropathy of the glenohumeral joint is increased from prior.   Electronically Signed   By: Suzy Bouchard M.D.   On: 09/16/2013 18:26     EKG Interpretation   Date/Time:  Friday September 16 2013 19:17:24 EDT Ventricular Rate:  79 PR Interval:  180 QRS Duration: 81 QT Interval:  395 QTC Calculation: 453 R Axis:   18 Text Interpretation:  Sinus rhythm Probable left atrial enlargement Low  voltage, precordial leads Similar to prior Confirmed by Mingo Amber  MD, [place]  ([id number]) on 09/16/2013 10:44:35 PM      MDM   Final diagnoses:  Fall, initial encounter  Facial laceration, initial encounter  Left supracondylar humerus fracture, closed, initial encounter  Humeral surgical neck fracture, left, closed, initial encounter    80F s/p mechanical fall at home. No  preceding CP, SOB. Has 4 cm laceration above L eyebrow. Also having some L elbow swelling. She is alert and oriented here. Laceration well approximated, see repair note. No facial deformities or tenderness. Cranial nerves intact. Diffuse neck tenderness on exam, no deformities. Lungs clear. Skin around shoulders is blue, per patient is due to lotion she's using. L shoulder tender - hx of arthritis, states pain is worse. Swelling at distal, lateral L humerus with decreased ROM of L elbow. NVI in arm distally.  Will CT her head, c-spine. Will xray shoulder, elbow of left arm. Xrays show L supracondylar and proximal L humeral neck fracture. Read as age indeterminate, but with fall and pain today, likely acute. I spoke with Dr. Onnie Graham with Ortho, who agreed with long arm splint and sling. Vicodin given. Stable for discharge.    Osvaldo Shipper, MD 09/16/13 2322

## 2013-09-16 NOTE — ED Notes (Signed)
Per EMS, Pt from home, tripped and fell and hit her head on a plastic trash can. Pt has 1 1/2 inch laceration by L eyebrow and has swelling to L elbow. Pt has hx of arthritis in L shoulder. Pt c/o pain in L arm. A&Ox4. Pt given 100 mcg of fentanyl en route.

## 2013-09-16 NOTE — ED Notes (Signed)
Ortho tech at bedside 

## 2013-09-16 NOTE — Progress Notes (Signed)
CSW met with patient and family at bedside to complete this assessment.  Patient reports that she tripped over the ironing board while attempting to get to the back porch to burn her trash. She reports a history of three back surgeries, broken nose, damaged ear, and other orthopedic injuries.  Patient reports that she has support from her son Jeanell Sparrow and his wife that lives about a mile away.  His wife assist her with her medication because sometimes she has memory issues that causes her to forget which ones to take.  She reports that her daughter Jeannene Patella and her husband lives a mile and half away.  Her family takes really good care of her and is never far away.  She reports never needing in the past inpatient skilled nursing placement because her family has always taking good care of her.  Patient's son reports that the patient has a walker, cane, bedside commode, and a shower chair.  CSW is still awaiting EDP for plan of treatment.     Chesley Noon, MSW, [place], 09/16/2013 Evening Clinical Social Worker [phone]

## 2013-09-21 ENCOUNTER — Other Ambulatory Visit: Payer: Self-pay | Admitting: Orthopedic Surgery

## 2013-09-21 ENCOUNTER — Ambulatory Visit
Admission: RE | Admit: 2013-09-21 | Discharge: 2013-09-21 | Disposition: A | Discharge status: Home / Self Care | Payer: Medicare Other | Source: Ambulatory Visit | Attending: Orthopedic Surgery | Admitting: Orthopedic Surgery

## 2013-09-21 DIAGNOSIS — M25522 Pain in left elbow: Secondary | ICD-10-CM

## 2013-10-13 ENCOUNTER — Emergency Department (HOSPITAL_COMMUNITY)
Admission: EM | Admit: 2013-10-13 | Discharge: 2013-10-13 | Disposition: A | Discharge status: Home / Self Care | Payer: Medicare Other | Attending: Emergency Medicine | Admitting: Emergency Medicine

## 2013-10-13 ENCOUNTER — Encounter (HOSPITAL_COMMUNITY): Payer: Self-pay | Admitting: Emergency Medicine

## 2013-10-13 ENCOUNTER — Emergency Department (HOSPITAL_COMMUNITY): Payer: Medicare Other

## 2013-10-13 DIAGNOSIS — Z79899 Other long term (current) drug therapy: Secondary | ICD-10-CM | POA: Insufficient documentation

## 2013-10-13 DIAGNOSIS — R296 Repeated falls: Secondary | ICD-10-CM | POA: Insufficient documentation

## 2013-10-13 DIAGNOSIS — Y92009 Unspecified place in unspecified non-institutional (private) residence as the place of occurrence of the external cause: Secondary | ICD-10-CM | POA: Insufficient documentation

## 2013-10-13 DIAGNOSIS — F3289 Other specified depressive episodes: Secondary | ICD-10-CM | POA: Insufficient documentation

## 2013-10-13 DIAGNOSIS — Z8719 Personal history of other diseases of the digestive system: Secondary | ICD-10-CM | POA: Insufficient documentation

## 2013-10-13 DIAGNOSIS — IMO0002 Reserved for concepts with insufficient information to code with codable children: Secondary | ICD-10-CM | POA: Insufficient documentation

## 2013-10-13 DIAGNOSIS — F329 Major depressive disorder, single episode, unspecified: Secondary | ICD-10-CM | POA: Insufficient documentation

## 2013-10-13 DIAGNOSIS — W19XXXA Unspecified fall, initial encounter: Secondary | ICD-10-CM

## 2013-10-13 DIAGNOSIS — Z8781 Personal history of (healed) traumatic fracture: Secondary | ICD-10-CM | POA: Insufficient documentation

## 2013-10-13 DIAGNOSIS — G894 Chronic pain syndrome: Secondary | ICD-10-CM | POA: Insufficient documentation

## 2013-10-13 DIAGNOSIS — Z9889 Other specified postprocedural states: Secondary | ICD-10-CM | POA: Insufficient documentation

## 2013-10-13 DIAGNOSIS — S79929A Unspecified injury of unspecified thigh, initial encounter: Secondary | ICD-10-CM

## 2013-10-13 DIAGNOSIS — Y9389 Activity, other specified: Secondary | ICD-10-CM | POA: Insufficient documentation

## 2013-10-13 DIAGNOSIS — S79919A Unspecified injury of unspecified hip, initial encounter: Secondary | ICD-10-CM | POA: Insufficient documentation

## 2013-10-13 MED ORDER — HYDROCODONE-ACETAMINOPHEN 5-325 MG PO TABS: 1.0000 | ORAL_TABLET | Freq: Four times a day (QID) | ORAL | Status: DC | PRN

## 2013-10-13 MED ORDER — HYDROCODONE-ACETAMINOPHEN 5-325 MG PO TABS
1.0000 | ORAL_TABLET | Freq: Once | ORAL | Status: AC
  Administered 2013-10-13: 1 via ORAL
  Filled 2013-10-13: qty 1

## 2013-10-13 NOTE — ED Provider Notes (Signed)
CSN: [id number]     Arrival date & time 10/13/13  0341 History   First MD Initiated Contact with Patient 10/13/13 0350     Chief Complaint  Patient presents with  . Fall     (Consider location/radiation/quality/duration/timing/severity/associated sxs/prior Treatment) HPI Comments: Patient states she stood to use the bedside commode, leaned over to raise the lid and lost her balance and fell onto her L side.  She has a fractures L humerus in a sling at this time ans was unable to get up on her own so used her life alert alarm   EMS assisted her to standing position, patient CO L hip and pelvis and back pain at this time She alsostates she hit her L cheek on a large vase, denies LOC or neck pain   Patient is a 80 y.o. female presenting with fall. The history is provided by the patient.  Fall This is a new problem. The current episode started today. The problem occurs constantly. The problem has been unchanged. Associated symptoms include arthralgias. Pertinent negatives include no chest pain, fever or headaches. The symptoms are aggravated by exertion. She has tried nothing for the symptoms. The treatment provided no relief.    Past Medical History  Diagnosis Date  . Colitis   . Depression   . S/P dilatation of esophageal stricture   . Chronic pain syndrome    Past Surgical History  Procedure Laterality Date  . Back surgery      x3  . Cholecystectomy    . Shoulder surgery    . Vein ligation and stripping    . Appendectomy    . Cardiovascular stress test  11/20/2009    ef 76%, no ischemia   Family History  Problem Relation Age of Onset  . Heart failure Sister   . Rheumatic fever Brother    History  Substance Use Topics  . Smoking status: Never Smoker   . Smokeless tobacco: Not on file  . Alcohol Use: No   OB History   Grav Para Term Preterm Abortions TAB SAB Ect Mult Living                 Review of Systems  Constitutional: Negative for fever.  Respiratory: Negative  for shortness of breath.   Cardiovascular: Negative for chest pain.  Musculoskeletal: Positive for arthralgias and back pain.  Skin: Negative for wound.  Neurological: Negative for dizziness and headaches.  All other systems reviewed and are negative.     Allergies  Codeine; Diazepam; Gabapentin; and Indomethacin  Home Medications   Prior to Admission medications   Medication Sig Start Date End Date Taking? Authorizing Provider  HYDROcodone-acetaminophen (NORCO/VICODIN) 5-325 MG per tablet Take 1 tablet by mouth every 6 (six) hours as needed for moderate pain. 09/16/13   Osvaldo Shipper, MD  HYDROcodone-acetaminophen (NORCO/VICODIN) 5-325 MG per tablet Take 1-2 tablets by mouth every 6 (six) hours as needed for moderate pain. 10/13/13   Garald Balding, NP  lisinopril (PRINIVIL,ZESTRIL) 20 MG tablet Take 20 mg by mouth daily.    Historical Provider, MD  meclizine (ANTIVERT) 25 MG tablet Take 25 mg by mouth every 6 (six) hours as needed for dizziness.    Historical Provider, MD  PARoxetine (PAXIL) 20 MG tablet Take 10 mg by mouth daily.    Historical Provider, MD  Vitamin D, Ergocalciferol, (DRISDOL) 50000 UNITS CAPS capsule Take 50,000 Units by mouth every 14 (fourteen) days.    Historical Provider, MD   BP  143/66  Pulse 82  Temp(Src) 98.5 F (36.9 C) (Oral)  Resp 16  SpO2 97% Physical Exam  Nursing note and vitals reviewed. Constitutional: She is oriented to person, place, and time. She appears well-developed and well-nourished.  HENT:  Head: Normocephalic.  Eyes: Pupils are equal, round, and reactive to light.  Neck: Normal range of motion.  Cardiovascular: Normal rate and regular rhythm.   Pulmonary/Chest: Effort normal.  Musculoskeletal: She exhibits tenderness. She exhibits no edema.       Left hip: She exhibits decreased range of motion and tenderness. She exhibits no swelling.       Legs: Pain with pelvic rock   Neurological: She is alert and oriented to person,  place, and time.  Skin: No erythema.    ED Course  Procedures (including critical care time) Labs Review Labs Reviewed - No data to display  Imaging Review Dg Pelvis 1-2 Views  10/13/2013   CLINICAL DATA:  Fall, posterior hip pain.  EXAM: PELVIS - 1-2 VIEW  COMPARISON:  None.  FINDINGS: There is no evidence of pelvic fracture or diastasis. Femoral heads are located. No other pelvic bone lesions are seen. Suture material in right abdomen. Upper lumbar posterior instrumentation, with lumbar laminectomies.  IMPRESSION: No acute fracture deformity or dislocation.   Electronically Signed   By: Elon Alas   On: 10/13/2013 04:24     EKG Interpretation None      MDM   Final diagnoses:  Fall as cause of accidental injury in home as place of occurrence         Garald Balding, NP 10/13/13 0502

## 2013-10-13 NOTE — ED Notes (Signed)
Per EMS pt coming from home with c/o fall while trying to reach bedside commode, Pt has broken left shoulder and per EMS pt stated she is not candidate for surgery. EMS found pt lying on her left side; denies LOC, not on blood thinners. Per EMS pt reports pain in her left buttock/hip area. Per EMS no obvious deformity to left hip. VSS

## 2013-10-14 NOTE — ED Provider Notes (Signed)
Pt fell trying to use the commode - landed on back - on exam has no pain in the pelvis and can lift both legs without difficutly - she has a known humerus frx on the L - in sling - at baseline - nv intact at the wrists.  Imagine neg for acute frx.  Medical screening examination/treatment/procedure(s) were conducted as a shared visit with non-physician practitioner(s) and myself.  I personally evaluated the patient during the encounter.  Clinical Impression: fall, back pain  Johnna Acosta, MD 10/14/13 [phone]

## 2014-04-12 ENCOUNTER — Other Ambulatory Visit: Payer: Self-pay | Admitting: Family Medicine

## 2014-04-12 DIAGNOSIS — R1310 Dysphagia, unspecified: Secondary | ICD-10-CM

## 2014-04-13 ENCOUNTER — Ambulatory Visit
Admission: RE | Admit: 2014-04-13 | Discharge: 2014-04-13 | Disposition: A | Discharge status: Home / Self Care | Payer: Medicare Other | Source: Ambulatory Visit | Attending: Family Medicine | Admitting: Family Medicine

## 2014-04-13 DIAGNOSIS — R1310 Dysphagia, unspecified: Secondary | ICD-10-CM

## 2014-05-30 ENCOUNTER — Encounter (HOSPITAL_COMMUNITY): Payer: Self-pay | Admitting: Emergency Medicine

## 2014-05-30 ENCOUNTER — Emergency Department (HOSPITAL_COMMUNITY)
Admission: EM | Admit: 2014-05-30 | Discharge: 2014-05-31 | Disposition: A | Discharge status: Home / Self Care | Payer: Medicare Other | Attending: Emergency Medicine | Admitting: Emergency Medicine

## 2014-05-30 ENCOUNTER — Emergency Department (HOSPITAL_COMMUNITY): Payer: Medicare Other

## 2014-05-30 DIAGNOSIS — Y9301 Activity, walking, marching and hiking: Secondary | ICD-10-CM | POA: Diagnosis not present

## 2014-05-30 DIAGNOSIS — D649 Anemia, unspecified: Secondary | ICD-10-CM | POA: Insufficient documentation

## 2014-05-30 DIAGNOSIS — Z79899 Other long term (current) drug therapy: Secondary | ICD-10-CM | POA: Diagnosis not present

## 2014-05-30 DIAGNOSIS — M810 Age-related osteoporosis without current pathological fracture: Secondary | ICD-10-CM | POA: Insufficient documentation

## 2014-05-30 DIAGNOSIS — Z8719 Personal history of other diseases of the digestive system: Secondary | ICD-10-CM | POA: Diagnosis not present

## 2014-05-30 DIAGNOSIS — S42001A Fracture of unspecified part of right clavicle, initial encounter for closed fracture: Secondary | ICD-10-CM

## 2014-05-30 DIAGNOSIS — Y92193 Bedroom in other specified residential institution as the place of occurrence of the external cause: Secondary | ICD-10-CM | POA: Insufficient documentation

## 2014-05-30 DIAGNOSIS — Z9889 Other specified postprocedural states: Secondary | ICD-10-CM | POA: Insufficient documentation

## 2014-05-30 DIAGNOSIS — F329 Major depressive disorder, single episode, unspecified: Secondary | ICD-10-CM | POA: Diagnosis not present

## 2014-05-30 DIAGNOSIS — W01198A Fall on same level from slipping, tripping and stumbling with subsequent striking against other object, initial encounter: Secondary | ICD-10-CM | POA: Diagnosis not present

## 2014-05-30 DIAGNOSIS — G894 Chronic pain syndrome: Secondary | ICD-10-CM | POA: Diagnosis not present

## 2014-05-30 DIAGNOSIS — S42031A Displaced fracture of lateral end of right clavicle, initial encounter for closed fracture: Secondary | ICD-10-CM | POA: Diagnosis not present

## 2014-05-30 DIAGNOSIS — Y998 Other external cause status: Secondary | ICD-10-CM | POA: Insufficient documentation

## 2014-05-30 DIAGNOSIS — W19XXXA Unspecified fall, initial encounter: Secondary | ICD-10-CM

## 2014-05-30 DIAGNOSIS — S4991XA Unspecified injury of right shoulder and upper arm, initial encounter: Secondary | ICD-10-CM | POA: Diagnosis present

## 2014-05-30 HISTORY — DX: Age-related osteoporosis without current pathological fracture: M81.0

## 2014-05-30 LAB — URINALYSIS, ROUTINE W REFLEX MICROSCOPIC
Bilirubin Urine: NEGATIVE
Glucose, UA: NEGATIVE mg/dL
Hgb urine dipstick: NEGATIVE
Ketones, ur: NEGATIVE mg/dL
LEUKOCYTES UA: NEGATIVE
Nitrite: NEGATIVE
PH: 7.5 (ref 5.0–8.0)
Protein, ur: NEGATIVE mg/dL
SPECIFIC GRAVITY, URINE: 1.011 (ref 1.005–1.030)
Urobilinogen, UA: 0.2 mg/dL (ref 0.0–1.0)

## 2014-05-30 LAB — BASIC METABOLIC PANEL
Anion gap: 4 — ABNORMAL LOW (ref 5–15)
BUN: 25 mg/dL — ABNORMAL HIGH (ref 6–23)
CHLORIDE: 107 mmol/L (ref 96–112)
CO2: 29 mmol/L (ref 19–32)
Calcium: 9.2 mg/dL (ref 8.4–10.5)
Creatinine, Ser: 0.75 mg/dL (ref 0.50–1.10)
GFR calc Af Amer: 90 mL/min — ABNORMAL LOW (ref >90)
GFR calc non Af Amer: 77 mL/min — ABNORMAL LOW (ref >90)
GLUCOSE: 101 mg/dL — AB (ref 70–99)
POTASSIUM: 4.7 mmol/L (ref 3.5–5.1)
SODIUM: 140 mmol/L (ref 135–145)

## 2014-05-30 LAB — CBC
HCT: 34.3 % — ABNORMAL LOW (ref 36.0–46.0)
Hemoglobin: 10.9 g/dL — ABNORMAL LOW (ref 12.0–15.0)
MCH: 29.6 pg (ref 26.0–34.0)
MCHC: 31.8 g/dL (ref 30.0–36.0)
MCV: 93.2 fL (ref 78.0–100.0)
PLATELETS: 222 10*3/uL (ref 150–400)
RBC: 3.68 MIL/uL — AB (ref 3.87–5.11)
RDW: 14.3 % (ref 11.5–15.5)
WBC: 7.1 10*3/uL (ref 4.0–10.5)

## 2014-05-30 LAB — I-STAT TROPONIN, ED: Troponin i, poc: 0 ng/mL (ref 0.00–0.08)

## 2014-05-30 LAB — POC OCCULT BLOOD, ED: Fecal Occult Bld: NEGATIVE

## 2014-05-30 MED ORDER — HYDROCODONE-ACETAMINOPHEN 5-325 MG PO TABS
1.0000 | ORAL_TABLET | Freq: Once | ORAL | Status: AC
  Administered 2014-05-30: 1 via ORAL
  Filled 2014-05-30: qty 1

## 2014-05-30 NOTE — ED Notes (Addendum)
Pt states that got dizzy in her bedroom and hit her R shoulder.Hx of surgery on shoulder. States she is supposed to walk with a walker but does not in her house.Head fullness now.  Alert and oriented.

## 2014-05-30 NOTE — ED Provider Notes (Signed)
CSN: [id number]     Arrival date & time 05/30/14  1839 History   First MD Initiated Contact with Patient 05/30/14 1945     Chief Complaint  Patient presents with  . Shoulder Pain     Patient is a 81 y.o. female presenting with shoulder pain. The history is provided by the patient. No language interpreter was used.  Shoulder Pain  Ms. Hauger presents for evaluation of right shoulder injury upon the fall. She reports that she was walking in her house and became dizzy described as vertiginous feeling and fell onto the closet striking her right shoulder. She denies any head injury or loss of consciousness. She has a history of difficulty with walking and weakness for multiple prior orthopedic injuries. She denies any chest pain, shortness of breath, abdominal pain, nausea, vomiting. She reports feeling chronically ill. She's had black stools since January. She denies any diarrhea or hematochezia. Symptoms are moderate and constant. Her right shoulder is more painful with range of motion.  Past Medical History  Diagnosis Date  . Colitis   . Depression   . S/P dilatation of esophageal stricture   . Chronic pain syndrome   . Osteoporosis    Past Surgical History  Procedure Laterality Date  . Back surgery      x3  . Cholecystectomy    . Shoulder surgery    . Vein ligation and stripping    . Appendectomy    . Cardiovascular stress test  11/20/2009    ef 76%, no ischemia   Family History  Problem Relation Age of Onset  . Heart failure Sister   . Rheumatic fever Brother    History  Substance Use Topics  . Smoking status: Never Smoker   . Smokeless tobacco: Not on file  . Alcohol Use: No   OB History    No data available     Review of Systems  All other systems reviewed and are negative.     Allergies  Codeine; Diazepam; Gabapentin; and Indomethacin  Home Medications   Prior to Admission medications   Medication Sig Start Date End Date Taking? Authorizing Provider   HYDROcodone-acetaminophen (NORCO/VICODIN) 5-325 MG per tablet Take 1 tablet by mouth every 6 (six) hours as needed for moderate pain. 09/16/13   Evelina Bucy, MD  HYDROcodone-acetaminophen (NORCO/VICODIN) 5-325 MG per tablet Take 1-2 tablets by mouth every 6 (six) hours as needed for moderate pain. 10/13/13   Garald Balding, NP  lisinopril (PRINIVIL,ZESTRIL) 20 MG tablet Take 20 mg by mouth daily.    Historical Provider, MD  meclizine (ANTIVERT) 25 MG tablet Take 25 mg by mouth every 6 (six) hours as needed for dizziness.    Historical Provider, MD  PARoxetine (PAXIL) 20 MG tablet Take 10 mg by mouth daily.    Historical Provider, MD  Vitamin D, Ergocalciferol, (DRISDOL) 50000 UNITS CAPS capsule Take 50,000 Units by mouth every 14 (fourteen) days.    Historical Provider, MD   BP 134/63 mmHg  Pulse 72  Temp(Src) 98.1 F (36.7 C) (Oral)  SpO2 99% Physical Exam  Constitutional: She is oriented to person, place, and time. She appears well-developed and well-nourished.  HENT:  Head: Normocephalic and atraumatic.  Right Ear: External ear normal.  Left Ear: External ear normal.  Eyes: Pupils are equal, round, and reactive to light.  Neck:  No C-spine tenderness  Cardiovascular: Normal rate and regular rhythm.   No murmur heard. Pulmonary/Chest: Effort normal and breath sounds normal. No respiratory distress.  Abdominal: Soft. There is no tenderness. There is no rebound and no guarding.  Genitourinary:  Brown stool in rectal vault  Musculoskeletal: She exhibits no edema.  Moderate tenderness over the right shoulder with decreased range of motion in the right shoulder secondary to pain. 2+ radial pulses in bilateral upper extremities.  Neurological: She is alert and oriented to person, place, and time.  Skin: Skin is warm and dry.  Psychiatric: She has a normal mood and affect. Her behavior is normal.  Nursing note and vitals reviewed.   ED Course  Procedures (including critical care  time) Labs Review Labs Reviewed  CBC - Abnormal; Notable for the following:    RBC 3.68 (*)    Hemoglobin 10.9 (*)    HCT 34.3 (*)    All other components within normal limits  BASIC METABOLIC PANEL - Abnormal; Notable for the following:    Glucose, Bld 101 (*)    BUN 25 (*)    GFR calc non Af Amer 77 (*)    GFR calc Af Amer 90 (*)    Anion gap 4 (*)    All other components within normal limits  URINE CULTURE  URINALYSIS, ROUTINE W REFLEX MICROSCOPIC  POC OCCULT BLOOD, ED  Randolm Idol, ED    Imaging Review Dg Chest 2 View  05/30/2014   CLINICAL DATA:  Patient fell onto right shoulder  EXAM: CHEST  2 VIEW  COMPARISON:  December 14, 2007  FINDINGS: There is no edema or consolidation. Heart size and pulmonary vascularity are within normal limits. No adenopathy. There are multiple old healed fractures on the right. No acute fracture. No pneumothorax or effusion. There is a total shoulder replacement on the right. There is advanced arthropathy in the left shoulder. There is postoperative change in the lower thoracic and upper lumbar spine. There is marked collapse of the T12 vertebral body, stable.  IMPRESSION: No edema or consolidation. No pneumothorax. Evidence of old trauma at several sites. Bones osteoporotic.   Electronically Signed   By: Lowella Grip III M.D.   On: 05/30/2014 21:33   Dg Shoulder Right  05/30/2014   CLINICAL DATA:  LEFT shoulder pain. SHOULDER PAIN initial encounter.  EXAM: RIGHT SHOULDER - 2+ VIEW  COMPARISON:  None.  FINDINGS: RIGHT shoulder hemiarthroplasty is present. Old RIGHT rib fractures noted. The RIGHT shoulder appears located. There is a distal RIGHT clavicle fracture present, which is minimally displaced. This appears acute.  IMPRESSION: 1. RIGHT shoulder hemi arthroplasty appears located. 2. Mildly displaced distal RIGHT clavicle fracture adjacent to the [facility] joint.   Electronically Signed   By: Dereck Ligas M.D.   On: 05/30/2014 21:33     EKG  Interpretation   Date/Time:  Tuesday May 30 2014 18:53:57 EST Ventricular Rate:  67 PR Interval:  163 QRS Duration: 77 QT Interval:  410 QTC Calculation: 433 R Axis:   34 Text Interpretation:  Sinus rhythm Probable left atrial enlargement  Confirmed by Hazle Coca [id number]) on 05/30/2014 7:45:33 PM      MDM   Final diagnoses:  Fall, initial encounter  Right clavicle fracture, closed, initial encounter  Anemia, unspecified anemia type    Patient here for evaluation of injuries following a fall. Patient has right shoulder tenderness with decreased range of motion, plain films consistent with acute clavicle fracture. Patient placed in sling for comfort. Patient is able to ambulate in the emergency department without difficulty and there is new new complaint of pain. In terms of fall,  this is not consistent with cardiac cause or acute infection. Patient has heme-negative brown stool, patient is noted to be anemic, which is likely chronic in nature. Discussed with patient and family PCP follow-up as well as to follow-up and return precautions. Patient does have home pain medications as does not need a prescription for pain medications at this time.    Quintella Reichert, MD 05/31/14 [id number]

## 2014-05-30 NOTE — ED Notes (Signed)
Pt's daughter reports pt stood up and became dizzy and fell-landed on her R shoulder.  She reports pt is supposed to ambulate with a walker but does not use it when she is at home.

## 2014-05-30 NOTE — ED Notes (Signed)
Bed: [facility]2 Expected date:  Expected time:  Means of arrival:  Comments: Ems

## 2014-05-31 NOTE — Discharge Instructions (Signed)
Wear the sling as needed for comfort for your clavicle (collar bone) fracture.  Your Hemoglobin (red blood cell count) was low today.  Please get rechecked by your doctor in the next week.  Get rechecked immediately if you develop any new or concerning symptoms.    Clavicle Fracture The clavicle, also called the collarbone, is the long bone that connects your shoulder to your rib cage. You can feel your collarbone at the top of your shoulders and rib cage. A clavicle fracture is a broken clavicle. It is a common injury that can happen at any age.  CAUSES Common causes of a clavicle fracture include:  A direct blow to your shoulder.  A car accident.  A fall, especially if you try to break your fall with an outstretched arm. RISK FACTORS You may be at increased risk if:  You are younger than 25 years or older than 34 years. Most clavicle fractures happen to people who are younger than 25 years.  You are a female.  You play contact sports. SIGNS AND SYMPTOMS A fractured clavicle is painful. It also makes it hard to move your arm. Other signs and symptoms may include:  A shoulder that drops downward and forward.  Pain when trying to lift your shoulder.  Bruising, swelling, and tenderness over your clavicle.  A grinding noise when you try to move your shoulder.  A bump over your clavicle. DIAGNOSIS Your health care provider can usually diagnose a clavicle fracture by asking about your injury and examining your shoulder and clavicle. He or she may take an X-ray to determine the position of your clavicle. TREATMENT Treatment depends on the position of your clavicle after the fracture:  If the broken ends of the bone are not out of place, your health care provider may put your arm in a sling or wrap a support bandage around your chest (figure-of-eight wrap).  If the broken ends of the bone are out of place, you may need surgery. Surgery may involve placing screws, pins, or plates to  keep your clavicle stable while it heals. Healing may take about 3 months. When your health care provider thinks your fracture has healed enough, you may have to do physical therapy to regain normal movement and build up your arm strength. HOME CARE INSTRUCTIONS   Apply ice to the injured area:  Put ice in a plastic bag.  Place a towel between your skin and the bag.  Leave the ice on for 20 minutes, 2-3 times a day.  If you have a wrap or splint:  Wear it all the time, and remove it only to take a bath or shower.  When you bathe or shower, keep your shoulder in the same position as when the sling or wrap is on.  Do not lift your arm.  If you have a figure-of-eight wrap:  Another person must tighten it every day.  It should be tight enough to hold your shoulders back.  Allow enough room to place your index finger between your body and the strap.  Loosen the wrap immediately if you feel numbness or tingling in your hands.  Only take medicines as directed by your health care provider.  Avoid activities that make the injury or pain worse for 4-6 weeks after surgery.  Keep all follow-up appointments. SEEK MEDICAL CARE IF:  Your medicine is not helping to relieve pain and swelling. SEEK IMMEDIATE MEDICAL CARE IF:  Your arm is numb, cold, or pale, even when the  splint is loose. MAKE SURE YOU:   Understand these instructions.  Will watch your condition.  Will get help right away if you are not doing well or get worse. Document Released: 12/25/2004 Document Revised: 03/22/2013 Document Reviewed: 02/07/2013 [facility] Patient Information 2015 [place], [place]. This information is not intended to replace advice given to you by your health care provider. Make sure you discuss any questions you have with your health care provider.  Fall Prevention and Home Safety Falls cause injuries and can affect all age groups. It is possible to use preventive measures to significantly decrease  the likelihood of falls. There are many simple measures which can make your home safer and prevent falls. OUTDOORS  Repair cracks and edges of walkways and driveways.  Remove high doorway thresholds.  Trim shrubbery on the main path into your home.  Have good outside lighting.  Clear walkways of tools, rocks, debris, and clutter.  Check that handrails are not broken and are securely fastened. Both sides of steps should have handrails.  Have leaves, snow, and ice cleared regularly.  Use sand or salt on walkways during winter months.  In the garage, clean up grease or oil spills. BATHROOM  Install night lights.  Install grab bars by the toilet and in the tub and shower.  Use non-skid mats or decals in the tub or shower.  Place a plastic non-slip stool in the shower to sit on, if needed.  Keep floors dry and clean up all water on the floor immediately.  Remove soap buildup in the tub or shower on a regular basis.  Secure bath mats with non-slip, double-sided rug tape.  Remove throw rugs and tripping hazards from the floors. BEDROOMS  Install night lights.  Make sure a bedside light is easy to reach.  Do not use oversized bedding.  Keep a telephone by your bedside.  Have a firm chair with side arms to use for getting dressed.  Remove throw rugs and tripping hazards from the floor. KITCHEN  Keep handles on pots and pans turned toward the center of the stove. Use back burners when possible.  Clean up spills quickly and allow time for drying.  Avoid walking on wet floors.  Avoid hot utensils and knives.  Position shelves so they are not too high or low.  Place commonly used objects within easy reach.  If necessary, use a sturdy step stool with a grab bar when reaching.  Keep electrical cables out of the way.  Do not use floor polish or wax that makes floors slippery. If you must use wax, use non-skid floor wax.  Remove throw rugs and tripping hazards  from the floor. STAIRWAYS  Never leave objects on stairs.  Place handrails on both sides of stairways and use them. Fix any loose handrails. Make sure handrails on both sides of the stairways are as long as the stairs.  Check carpeting to make sure it is firmly attached along stairs. Make repairs to worn or loose carpet promptly.  Avoid placing throw rugs at the top or bottom of stairways, or properly secure the rug with carpet tape to prevent slippage. Get rid of throw rugs, if possible.  Have an electrician put in a light switch at the top and bottom of the stairs. OTHER FALL PREVENTION TIPS  Wear low-heel or rubber-soled shoes that are supportive and fit well. Wear closed toe shoes.  When using a stepladder, make sure it is fully opened and both spreaders are firmly locked. Do  not climb a closed stepladder.  Add color or contrast paint or tape to grab bars and handrails in your home. Place contrasting color strips on first and last steps.  Learn and use mobility aids as needed. Install an electrical emergency response system.  Turn on lights to avoid dark areas. Replace light bulbs that burn out immediately. Get light switches that glow.  Arrange furniture to create clear pathways. Keep furniture in the same place.  Firmly attach carpet with non-skid or double-sided tape.  Eliminate uneven floor surfaces.  Select a carpet pattern that does not visually hide the edge of steps.  Be aware of all pets. OTHER HOME SAFETY TIPS  Set the water temperature for 120 F (48.8 C).  Keep emergency numbers on or near the telephone.  Keep smoke detectors on every level of the home and near sleeping areas. Document Released: 03/07/2002 Document Revised: 09/16/2011 Document Reviewed: 06/06/2011 [facility] Patient Information 2015 [place], [place]. This information is not intended to replace advice given to you by your health care provider. Make sure you discuss any questions you have with  your health care provider.

## 2014-06-01 LAB — URINE CULTURE

## 2016-08-19 ENCOUNTER — Emergency Department (HOSPITAL_COMMUNITY)
Admission: EM | Admit: 2016-08-19 | Discharge: 2016-08-19 | Disposition: A | Discharge status: Home / Self Care | Payer: Medicare Other | Attending: Emergency Medicine | Admitting: Emergency Medicine

## 2016-08-19 ENCOUNTER — Emergency Department (HOSPITAL_COMMUNITY): Payer: Medicare Other

## 2016-08-19 ENCOUNTER — Encounter (HOSPITAL_COMMUNITY): Payer: Self-pay | Admitting: *Deleted

## 2016-08-19 DIAGNOSIS — M545 Low back pain: Secondary | ICD-10-CM | POA: Insufficient documentation

## 2016-08-19 DIAGNOSIS — Y92007 Garden or yard of unspecified non-institutional (private) residence as the place of occurrence of the external cause: Secondary | ICD-10-CM | POA: Insufficient documentation

## 2016-08-19 DIAGNOSIS — N3000 Acute cystitis without hematuria: Secondary | ICD-10-CM

## 2016-08-19 DIAGNOSIS — S0990XA Unspecified injury of head, initial encounter: Secondary | ICD-10-CM | POA: Diagnosis present

## 2016-08-19 DIAGNOSIS — M25562 Pain in left knee: Secondary | ICD-10-CM | POA: Insufficient documentation

## 2016-08-19 DIAGNOSIS — W19XXXA Unspecified fall, initial encounter: Secondary | ICD-10-CM | POA: Diagnosis not present

## 2016-08-19 DIAGNOSIS — Y999 Unspecified external cause status: Secondary | ICD-10-CM | POA: Insufficient documentation

## 2016-08-19 DIAGNOSIS — Z79899 Other long term (current) drug therapy: Secondary | ICD-10-CM | POA: Insufficient documentation

## 2016-08-19 DIAGNOSIS — M546 Pain in thoracic spine: Secondary | ICD-10-CM | POA: Insufficient documentation

## 2016-08-19 DIAGNOSIS — S0093XA Contusion of unspecified part of head, initial encounter: Secondary | ICD-10-CM

## 2016-08-19 DIAGNOSIS — S0083XA Contusion of other part of head, initial encounter: Secondary | ICD-10-CM | POA: Insufficient documentation

## 2016-08-19 DIAGNOSIS — Y9389 Activity, other specified: Secondary | ICD-10-CM | POA: Diagnosis not present

## 2016-08-19 DIAGNOSIS — M79604 Pain in right leg: Secondary | ICD-10-CM | POA: Insufficient documentation

## 2016-08-19 DIAGNOSIS — I639 Cerebral infarction, unspecified: Secondary | ICD-10-CM

## 2016-08-19 LAB — BASIC METABOLIC PANEL
Anion gap: 8 (ref 5–15)
BUN: 21 mg/dL — AB (ref 6–20)
CO2: 27 mmol/L (ref 22–32)
Calcium: 9.4 mg/dL (ref 8.9–10.3)
Chloride: 107 mmol/L (ref 101–111)
Creatinine, Ser: 0.9 mg/dL (ref 0.44–1.00)
GFR calc Af Amer: >60 mL/min (ref >60)
GFR calc non Af Amer: 58 mL/min — ABNORMAL LOW (ref >60)
Glucose, Bld: 87 mg/dL (ref 65–99)
Potassium: 4.2 mmol/L (ref 3.5–5.1)
Sodium: 142 mmol/L (ref 135–145)

## 2016-08-19 LAB — CBC WITH DIFFERENTIAL/PLATELET
Basophils Absolute: 0 10*3/uL (ref 0.0–0.1)
Basophils Relative: 0 %
EOS PCT: 3 %
Eosinophils Absolute: 0.2 10*3/uL (ref 0.0–0.7)
HCT: 37.3 % (ref 36.0–46.0)
Hemoglobin: 12 g/dL (ref 12.0–15.0)
Lymphocytes Relative: 21 %
Lymphs Abs: 1.5 10*3/uL (ref 0.7–4.0)
MCH: 29.1 pg (ref 26.0–34.0)
MCHC: 32.2 g/dL (ref 30.0–36.0)
MCV: 90.5 fL (ref 78.0–100.0)
Monocytes Absolute: 0.5 10*3/uL (ref 0.1–1.0)
Monocytes Relative: 8 %
Neutro Abs: 4.9 10*3/uL (ref 1.7–7.7)
Neutrophils Relative %: 68 %
Platelets: 204 10*3/uL (ref 150–400)
RBC: 4.12 MIL/uL (ref 3.87–5.11)
RDW: 13.4 % (ref 11.5–15.5)
WBC: 7.1 10*3/uL (ref 4.0–10.5)

## 2016-08-19 LAB — URINALYSIS, ROUTINE W REFLEX MICROSCOPIC
Bilirubin Urine: NEGATIVE
Glucose, UA: NEGATIVE mg/dL
KETONES UR: NEGATIVE mg/dL
Nitrite: NEGATIVE
Protein, ur: NEGATIVE mg/dL
Specific Gravity, Urine: 1.011 (ref 1.005–1.030)
pH: 6 (ref 5.0–8.0)

## 2016-08-19 MED ORDER — CEPHALEXIN 500 MG PO CAPS: 500.0000 mg | ORAL_CAPSULE | Freq: Three times a day (TID) | ORAL | 0 refills | Status: AC

## 2016-08-19 NOTE — ED Triage Notes (Signed)
Per EMS- pt was outside working in [employer] garden. Pt reported that she leaned over too far and fell. Pt has injury to her forehead. Pt has hx of dementia.

## 2016-08-19 NOTE — ED Provider Notes (Signed)
[facility] DEPT Provider Note   CSN: [id number] Arrival date & time: 08/19/16  1205     History   Chief Complaint Chief Complaint  Patient presents with  . Fall    HPI Alicia Roth is a 83 y.o. female.  Patient lives at home alone. Has a history of dementia. Over the last few weeks has had multiple falls. Today, the patient was doing some gardening, had a mechanical fall. Hit her head on the ground. No prodromal symptoms. Did not lose consciousness. Had pain and swelling above her left eye where she hit her head. Also endorses chronic pain in bilateral lower extremities as well as in her lower back.   The history is provided by the patient and a relative.  Illness  This is a new problem. The current episode started 1 to 2 hours ago. The problem occurs constantly. The problem has not changed since onset.Associated symptoms include headaches. Pertinent negatives include no chest pain, no abdominal pain and no shortness of breath. She has tried nothing for the symptoms.    Past Medical History:  Diagnosis Date  . Chronic pain syndrome   . Colitis   . Depression   . Osteoporosis   . S/P dilatation of esophageal stricture     There are no active problems to display for this patient.   Past Surgical History:  Procedure Laterality Date  . APPENDECTOMY    . BACK SURGERY     x3  . CARDIOVASCULAR STRESS TEST  11/20/2009   ef 76%, no ischemia  . CHOLECYSTECTOMY    . SHOULDER SURGERY    . VEIN LIGATION AND STRIPPING      OB History    No data available       Home Medications    Prior to Admission medications   Medication Sig Start Date End Date Taking? Authorizing Provider  acetaminophen (TYLENOL) 500 MG tablet Take 500 mg by mouth every 6 (six) hours as needed for mild pain.    [provider]  cephALEXin (KEFLEX) 500 MG capsule Take 1 capsule (500 mg total) by mouth 3 (three) times daily. 08/19/16 08/24/16  Maryan Puls, MD  HYDROcodone-acetaminophen  (NORCO/VICODIN) 5-325 MG per tablet Take 1 tablet by mouth every 6 (six) hours as needed for moderate pain. Patient not taking: Reported on 05/30/2014 09/16/13   Evelina Bucy, MD  HYDROcodone-acetaminophen (NORCO/VICODIN) 5-325 MG per tablet Take 1-2 tablets by mouth every 6 (six) hours as needed for moderate pain. Patient not taking: Reported on 05/30/2014 10/13/13   Junius Creamer, NP  lisinopril (PRINIVIL,ZESTRIL) 20 MG tablet Take 20 mg by mouth daily.    [provider]  OVER THE COUNTER MEDICATION Take 1 tablet by mouth daily. Over the counter vitamin for bones    [provider]  PARoxetine (PAXIL) 20 MG tablet Take 10 mg by mouth daily.    [provider]    Family History Family History  Problem Relation Age of Onset  . Heart failure Sister   . Rheumatic fever Brother     Social History Social History  Substance Use Topics  . Smoking status: Never Smoker  . Smokeless tobacco: Not on file  . Alcohol use No     Allergies   Codeine; Diazepam; Gabapentin; and Indomethacin   Review of Systems Review of Systems  Eyes: Negative for visual disturbance.  Respiratory: Negative for shortness of breath.   Cardiovascular: Negative for chest pain and palpitations.  Gastrointestinal: Negative for abdominal pain, nausea  and vomiting.  Musculoskeletal: Positive for back pain. Negative for neck pain.  Skin: Positive for wound.  Neurological: Positive for headaches. Negative for syncope, facial asymmetry, speech difficulty, weakness, light-headedness and numbness.  All other systems reviewed and are negative.    Physical Exam Updated Vital Signs BP (!) 152/74   Pulse 62   Temp 98.3 F (36.8 C) (Oral)   Resp 13   SpO2 98%   Physical Exam  Constitutional: She appears well-developed and well-nourished. No distress.  HENT:  Ecchymosis, swelling above the left eye. Superficial abrasions. I was able to open the patient's left eye. EOMI. Denies diplopia with  ranging eyes. No palpable underlying skull fracture. No hemotympanum.  Eyes: Conjunctivae are normal.  Neck: Neck supple.  No tenderness to palpation of midline cervical spine. No step-offs.  Cardiovascular: Normal rate and regular rhythm.   No murmur heard. Pulmonary/Chest: Effort normal and breath sounds normal. No respiratory distress.  Abdominal: Soft. There is no tenderness.  Musculoskeletal: She exhibits no edema.  Tenderness to palpation of the midline thoracic and lumbar spine. No step-offs. Right lower extremity with tenderness to palpation from the right knee to the right ankle. No gross deformity. Neurovascularly intact. Full range of motion of all joints. Left lower extremity with tetanus to palpation over the knee. No gross deformities. Neurovascularly intact. Full range of motion of all joints.  Neurological: She is alert. She has normal strength. No cranial nerve deficit or sensory deficit. Coordination normal. GCS eye subscore is 4. GCS verbal subscore is 5. GCS motor subscore is 6.  Skin: Skin is warm and dry.  Psychiatric: She has a normal mood and affect.  Nursing note and vitals reviewed.    ED Treatments / Results  Labs (all labs ordered are listed, but only abnormal results are displayed) Labs Reviewed  BASIC METABOLIC PANEL - Abnormal; Notable for the following:       Result Value   BUN 21 (*)    GFR calc non Af Amer 58 (*)    All other components within normal limits  URINALYSIS, ROUTINE W REFLEX MICROSCOPIC - Abnormal; Notable for the following:    Color, Urine AMBER (*)    Hgb urine dipstick SMALL (*)    Leukocytes, UA MODERATE (*)    Bacteria, UA RARE (*)    Squamous Epithelial / LPF 0-5 (*)    All other components within normal limits  URINE CULTURE  CBC WITH DIFFERENTIAL/PLATELET    EKG  EKG Interpretation None       Radiology Dg Knee 2 Views Left  Result Date: 08/19/2016 CLINICAL DATA:  Fall EXAM: LEFT KNEE - 1-2 VIEW COMPARISON:  None.  FINDINGS: Moderate to advanced degenerative change in the lateral joint compartment with joint space narrowing and spurring. Medial joint compartment intact. Patellofemoral joint normal. Negative for fracture or effusion IMPRESSION: Moderate to advanced degenerative change laterally. Negative for fracture. Electronically Signed   By: Franchot Gallo M.D.   On: 08/19/2016 14:04   Dg Knee 2 Views Right  Result Date: 08/19/2016 CLINICAL DATA:  Fall with knee pain EXAM: RIGHT KNEE - 1-2 VIEW COMPARISON:  None. FINDINGS: Tibial spine osteophytes. No fracture or dislocation. Moderate patellofemoral degenerative changes with spurring. Moderate narrowing of the medial compartment. Moderate-to-marked narrowing of the lateral compartment with spurring. Vascular calcification. No large effusion. IMPRESSION: No acute osseous abnormality. Moderate degenerative changes of the right knee Electronically Signed   By: Donavan Foil M.D.   On: 08/19/2016 14:05  Dg Tibia/fibula Right  Result Date: 08/19/2016 CLINICAL DATA:  Fall with lower extremity pain EXAM: RIGHT TIBIA AND FIBULA - 2 VIEW COMPARISON:  None. FINDINGS: There is no evidence of fracture or other focal bone lesions. Soft tissues are unremarkable. IMPRESSION: Negative. Electronically Signed   By: Donavan Foil M.D.   On: 08/19/2016 14:06   Dg Ankle 2 Views Right  Result Date: 08/19/2016 CLINICAL DATA:  Fall with pain EXAM: RIGHT ANKLE - 2 VIEW COMPARISON:  None. FINDINGS: No fracture or malalignment. Small plantar calcaneal spur. Mild soft tissue swelling. IMPRESSION: No acute osseous abnormality Electronically Signed   By: Donavan Foil M.D.   On: 08/19/2016 14:04   Ct Head Wo Contrast  Result Date: 08/19/2016 CLINICAL DATA:  Multiple falls. Bruising and swelling around the left eye. Initial encounter. EXAM: CT HEAD WITHOUT CONTRAST CT MAXILLOFACIAL WITHOUT CONTRAST CT CERVICAL SPINE WITHOUT CONTRAST TECHNIQUE: Multidetector CT imaging of the head,  cervical spine, and maxillofacial structures were performed using the standard protocol without intravenous contrast. Multiplanar CT image reconstructions of the cervical spine and maxillofacial structures were also generated. COMPARISON:  09/16/2013 head and cervical spine CT. FINDINGS: CT HEAD FINDINGS Brain: No evidence of acute infarction, hemorrhage, hydrocephalus, extra-axial collection or mass lesion/mass effect.Atrophy, most notable in the temporal poles, stable from 2015. Vascular: Atherosclerotic calcification. Skull: Negative for fracture.  Left forehead hematoma. CT MAXILLOFACIAL FINDINGS Osseous: Negative for fracture or mandibular dislocation. Marked alveolar ridge atrophy in this edentulous patient. Orbits: Preseptal swelling on the left. Bilateral cataract resection. No postseptal hemorrhage noted. Sinuses: Negative for hemosinus. Notable rightward nasal septal deviation. Mild mucosal thickening in the ethmoids the nasal cavity. Soft tissues: Left face and periorbital contusion contiguous with left frontal hematoma. No opaque foreign body. Cervical carotid atherosclerosis. CT CERVICAL SPINE FINDINGS Alignment: Slight T1-2 anterolisthesis, facet mediated. No traumatic malalignment. Skull base and vertebrae: Negative for acute fracture. Soft tissues and spinal canal: No prevertebral fluid or swelling. No visible canal hematoma. Disc levels: Diffuse disc narrowing and facet spurring. No high-grade canal stenosis suspected. Upper chest: No acute finding IMPRESSION: 1. No evidence of acute intracranial or cervical spine injury. 2. Left forehead and facial hematoma without fracture. Electronically Signed   By: Monte Fantasia M.D.   On: 08/19/2016 15:40   Ct Cervical Spine Wo Contrast  Result Date: 08/19/2016 CLINICAL DATA:  Multiple falls. Bruising and swelling around the left eye. Initial encounter. EXAM: CT HEAD WITHOUT CONTRAST CT MAXILLOFACIAL WITHOUT CONTRAST CT CERVICAL SPINE WITHOUT CONTRAST  TECHNIQUE: Multidetector CT imaging of the head, cervical spine, and maxillofacial structures were performed using the standard protocol without intravenous contrast. Multiplanar CT image reconstructions of the cervical spine and maxillofacial structures were also generated. COMPARISON:  09/16/2013 head and cervical spine CT. FINDINGS: CT HEAD FINDINGS Brain: No evidence of acute infarction, hemorrhage, hydrocephalus, extra-axial collection or mass lesion/mass effect.Atrophy, most notable in the temporal poles, stable from 2015. Vascular: Atherosclerotic calcification. Skull: Negative for fracture.  Left forehead hematoma. CT MAXILLOFACIAL FINDINGS Osseous: Negative for fracture or mandibular dislocation. Marked alveolar ridge atrophy in this edentulous patient. Orbits: Preseptal swelling on the left. Bilateral cataract resection. No postseptal hemorrhage noted. Sinuses: Negative for hemosinus. Notable rightward nasal septal deviation. Mild mucosal thickening in the ethmoids the nasal cavity. Soft tissues: Left face and periorbital contusion contiguous with left frontal hematoma. No opaque foreign body. Cervical carotid atherosclerosis. CT CERVICAL SPINE FINDINGS Alignment: Slight T1-2 anterolisthesis, facet mediated. No traumatic malalignment. Skull base and vertebrae: Negative for acute fracture.  Soft tissues and spinal canal: No prevertebral fluid or swelling. No visible canal hematoma. Disc levels: Diffuse disc narrowing and facet spurring. No high-grade canal stenosis suspected. Upper chest: No acute finding IMPRESSION: 1. No evidence of acute intracranial or cervical spine injury. 2. Left forehead and facial hematoma without fracture. Electronically Signed   By: Monte Fantasia M.D.   On: 08/19/2016 15:40   Ct Thoracic Spine Wo Contrast  Result Date: 08/19/2016 CLINICAL DATA:  Multiple falls this past week. Left periorbital bruising. Thoracic and lumbar spine pain. Initial encounter. EXAM: CT THORACIC AND  LUMBAR SPINE WITHOUT CONTRAST TECHNIQUE: Multidetector CT imaging of the thoracic and lumbar spine was performed without contrast. Multiplanar CT image reconstructions were also generated. COMPARISON:  Lumbar spine MRI 02/02/2009 FINDINGS: CT THORACIC SPINE FINDINGS Alignment: No traumatic malalignment. Slight anterolisthesis at T2-3, which is fused across the facets. Vertebrae: Chronic appearing superior endplate concavity at T5 and T8. There is a remote T12 fracture with posterior fixation. No acute fracture noted. No evidence of bone lesion or endplate erosion. T9-L3 posterior rod and screw fixation. Screws spared at the T12 level. T9 and T10 left pedicle screws are left lateral of the vertebral body and contact the posterior wall of the descending aorta without vessel deformity. Paraspinal and other soft tissues: Negative Disc levels: No evidence of degenerative impingement. CT LUMBAR SPINE FINDINGS Segmentation: 5 lumbar type vertebrae. Alignment: No traumatic malalignment. Vertebrae: Remote T12 fracture with cement augmentation. There is posterior fixation hardware from T9-L3. Discectomy changes at L2-3. No arthrodesis seen at T12-L1 where there was posterior decompression. Elsewhere, there is solid arthrodesis. Negative for acute fracture. No evidence of bone lesion or discitis. Osteopenia. Paraspinal and other soft tissues: No acute finding. Atherosclerosis and colonic diverticulosis. Disc levels: Advanced adjacent segment disc and facet degeneration at L3-4 with bilateral foraminal impingement. Spinal stenosis at this level is mild. L4-5 and L5-S1 facet arthropathy with posterior decompression. Spinal canal is widely patent at these levels. IMPRESSION: 1. No evidence of acute thoracic or lumbar spine injury. 2. Remote T12 fracture with T9-L3 posterior fixation. There is solid arthrodesis seen except at T12-L1. No hardware failure. 3. T9 and T10 left pedicle screws contact the posterior wall of the aorta  without vessel deformity. 4. L3-4 advanced adjacent segment disc and facet degeneration with biforaminal impingement. Electronically Signed   By: Monte Fantasia M.D.   On: 08/19/2016 15:30   Ct Lumbar Spine Wo Contrast  Result Date: 08/19/2016 CLINICAL DATA:  Multiple falls this past week. Left periorbital bruising. Thoracic and lumbar spine pain. Initial encounter. EXAM: CT THORACIC AND LUMBAR SPINE WITHOUT CONTRAST TECHNIQUE: Multidetector CT imaging of the thoracic and lumbar spine was performed without contrast. Multiplanar CT image reconstructions were also generated. COMPARISON:  Lumbar spine MRI 02/02/2009 FINDINGS: CT THORACIC SPINE FINDINGS Alignment: No traumatic malalignment. Slight anterolisthesis at T2-3, which is fused across the facets. Vertebrae: Chronic appearing superior endplate concavity at T5 and T8. There is a remote T12 fracture with posterior fixation. No acute fracture noted. No evidence of bone lesion or endplate erosion. T9-L3 posterior rod and screw fixation. Screws spared at the T12 level. T9 and T10 left pedicle screws are left lateral of the vertebral body and contact the posterior wall of the descending aorta without vessel deformity. Paraspinal and other soft tissues: Negative Disc levels: No evidence of degenerative impingement. CT LUMBAR SPINE FINDINGS Segmentation: 5 lumbar type vertebrae. Alignment: No traumatic malalignment. Vertebrae: Remote T12 fracture with cement augmentation. There is posterior fixation  hardware from T9-L3. Discectomy changes at L2-3. No arthrodesis seen at T12-L1 where there was posterior decompression. Elsewhere, there is solid arthrodesis. Negative for acute fracture. No evidence of bone lesion or discitis. Osteopenia. Paraspinal and other soft tissues: No acute finding. Atherosclerosis and colonic diverticulosis. Disc levels: Advanced adjacent segment disc and facet degeneration at L3-4 with bilateral foraminal impingement. Spinal stenosis at this  level is mild. L4-5 and L5-S1 facet arthropathy with posterior decompression. Spinal canal is widely patent at these levels. IMPRESSION: 1. No evidence of acute thoracic or lumbar spine injury. 2. Remote T12 fracture with T9-L3 posterior fixation. There is solid arthrodesis seen except at T12-L1. No hardware failure. 3. T9 and T10 left pedicle screws contact the posterior wall of the aorta without vessel deformity. 4. L3-4 advanced adjacent segment disc and facet degeneration with biforaminal impingement. Electronically Signed   By: Monte Fantasia M.D.   On: 08/19/2016 15:30   Ct Maxillofacial Wo Contrast  Result Date: 08/19/2016 CLINICAL DATA:  Multiple falls. Bruising and swelling around the left eye. Initial encounter. EXAM: CT HEAD WITHOUT CONTRAST CT MAXILLOFACIAL WITHOUT CONTRAST CT CERVICAL SPINE WITHOUT CONTRAST TECHNIQUE: Multidetector CT imaging of the head, cervical spine, and maxillofacial structures were performed using the standard protocol without intravenous contrast. Multiplanar CT image reconstructions of the cervical spine and maxillofacial structures were also generated. COMPARISON:  09/16/2013 head and cervical spine CT. FINDINGS: CT HEAD FINDINGS Brain: No evidence of acute infarction, hemorrhage, hydrocephalus, extra-axial collection or mass lesion/mass effect.Atrophy, most notable in the temporal poles, stable from 2015. Vascular: Atherosclerotic calcification. Skull: Negative for fracture.  Left forehead hematoma. CT MAXILLOFACIAL FINDINGS Osseous: Negative for fracture or mandibular dislocation. Marked alveolar ridge atrophy in this edentulous patient. Orbits: Preseptal swelling on the left. Bilateral cataract resection. No postseptal hemorrhage noted. Sinuses: Negative for hemosinus. Notable rightward nasal septal deviation. Mild mucosal thickening in the ethmoids the nasal cavity. Soft tissues: Left face and periorbital contusion contiguous with left frontal hematoma. No opaque  foreign body. Cervical carotid atherosclerosis. CT CERVICAL SPINE FINDINGS Alignment: Slight T1-2 anterolisthesis, facet mediated. No traumatic malalignment. Skull base and vertebrae: Negative for acute fracture. Soft tissues and spinal canal: No prevertebral fluid or swelling. No visible canal hematoma. Disc levels: Diffuse disc narrowing and facet spurring. No high-grade canal stenosis suspected. Upper chest: No acute finding IMPRESSION: 1. No evidence of acute intracranial or cervical spine injury. 2. Left forehead and facial hematoma without fracture. Electronically Signed   By: Monte Fantasia M.D.   On: 08/19/2016 15:40    Procedures Procedures (including critical care time)  Medications Ordered in ED Medications - No data to display   Initial Impression / Assessment and Plan / ED Course  I have reviewed the triage vital signs and the nursing notes.  Pertinent labs & imaging results that were available during my care of the patient were reviewed by me and considered in my medical decision making (see chart for details).     Given findings on physical exam, performed a CT of the patient's head, face, spine. No evidence of Chin medical injury. No evidence of intracranial blood. No evidence of fracture. X-rays of the patient's lower extremities obtained. No evidence of fracture. Labs reassuring. No evidence of leukocytosis. No anemia. No electrolyte abnormality. Urinalysis was questionable for urinary tract infection. We will treat that empirically. Patient denies any prodromal symptoms that caused her to fall. EKG without ischemic changes or interval abnormalities. Likely mechanical in nature exclusively. Patient ambulatory throughout the emergency department  prior to discharge.  Final Clinical Impressions(s) / ED Diagnoses   Final diagnoses:  Contusion of head, unspecified part of head, initial encounter  Acute cystitis without hematuria    New Prescriptions Discharge Medication List  as of 08/19/2016  3:50 PM    START taking these medications   Details  cephALEXin (KEFLEX) 500 MG capsule Take 1 capsule (500 mg total) by mouth 3 (three) times daily., Starting Tue 08/19/2016, Until Sun 08/24/2016, Print         Maryan Puls, MD 08/19/16 [id number]    Gareth Morgan, MD 08/20/16 [phone]

## 2016-08-21 LAB — URINE CULTURE

## 2016-08-22 ENCOUNTER — Telehealth: Payer: Self-pay | Admitting: *Deleted

## 2016-08-22 NOTE — Telephone Encounter (Signed)
Post ED Visit - Positive Culture Follow-up: Unsuccessful Patient Follow-up  Culture assessed and recommendations reviewed by:  []  Elenor Quinones, Pharm.D. []  Heide Guile, Pharm.D., BCPS AQ-ID []  Parks Neptune, Pharm.D., BCPS []  Alycia Rossetti, Pharm.D., BCPS []  [place], Pharm.D., BCPS, AAHIVP []  Legrand Como, Pharm.D., BCPS, AAHIVP []  Salome Arnt, PharmD, BCPS []  Dimitri Ped, PharmD, BCPS []  Vincenza Hews, PharmD, BCPS  Positive urine culture  []  Patient discharged without antimicrobial prescription and treatment is now indicated [x]  Organism is resistant to prescribed ED discharge antimicrobial []  Patient with positive blood cultures   Unable to contact patient after 3 attempts, letter will be sent to address on file  Ardeen Fillers 08/22/2016, 9:23 AM

## 2016-08-30 ENCOUNTER — Telehealth: Payer: Self-pay

## 2016-08-30 NOTE — Telephone Encounter (Signed)
Called back in response to letter sent to home for symptom check for urinary problems since ED visit on 08/19/16   No further problems.

## 2016-09-22 ENCOUNTER — Other Ambulatory Visit: Payer: Self-pay | Admitting: Otolaryngology

## 2016-09-22 DIAGNOSIS — H903 Sensorineural hearing loss, bilateral: Secondary | ICD-10-CM

## 2016-09-22 DIAGNOSIS — R42 Dizziness and giddiness: Secondary | ICD-10-CM

## 2016-09-26 ENCOUNTER — Ambulatory Visit
Admission: RE | Admit: 2016-09-26 | Discharge: 2016-09-26 | Disposition: A | Discharge status: Home / Self Care | Payer: Medicare Other | Source: Ambulatory Visit | Attending: Otolaryngology | Admitting: Otolaryngology

## 2016-09-26 DIAGNOSIS — H903 Sensorineural hearing loss, bilateral: Secondary | ICD-10-CM | POA: Diagnosis present

## 2016-09-26 DIAGNOSIS — R42 Dizziness and giddiness: Secondary | ICD-10-CM

## 2016-09-26 MED ORDER — GADOBENATE DIMEGLUMINE 529 MG/ML IV SOLN
15.0000 mL | Freq: Once | INTRAVENOUS | Status: AC | PRN
  Administered 2016-09-26: 15 mL via INTRAVENOUS

## 2016-11-08 ENCOUNTER — Emergency Department (HOSPITAL_COMMUNITY): Payer: Medicare Other

## 2016-11-08 ENCOUNTER — Inpatient Hospital Stay (HOSPITAL_COMMUNITY)
Admission: EM | Admit: 2016-11-08 | Discharge: 2016-11-12 | DRG: 871 | Disposition: A | Discharge status: Skilled Nursing Facility | Payer: Medicare Other | Attending: Internal Medicine | Admitting: Internal Medicine

## 2016-11-08 DIAGNOSIS — W19XXXA Unspecified fall, initial encounter: Secondary | ICD-10-CM | POA: Diagnosis present

## 2016-11-08 DIAGNOSIS — Z885 Allergy status to narcotic agent status: Secondary | ICD-10-CM

## 2016-11-08 DIAGNOSIS — S0003XA Contusion of scalp, initial encounter: Secondary | ICD-10-CM | POA: Diagnosis present

## 2016-11-08 DIAGNOSIS — F0390 Unspecified dementia without behavioral disturbance: Secondary | ICD-10-CM | POA: Diagnosis present

## 2016-11-08 DIAGNOSIS — Z9181 History of falling: Secondary | ICD-10-CM | POA: Diagnosis not present

## 2016-11-08 DIAGNOSIS — Z7982 Long term (current) use of aspirin: Secondary | ICD-10-CM | POA: Diagnosis not present

## 2016-11-08 DIAGNOSIS — R6520 Severe sepsis without septic shock: Secondary | ICD-10-CM | POA: Diagnosis present

## 2016-11-08 DIAGNOSIS — F32A Depression, unspecified: Secondary | ICD-10-CM

## 2016-11-08 DIAGNOSIS — F329 Major depressive disorder, single episode, unspecified: Secondary | ICD-10-CM | POA: Diagnosis present

## 2016-11-08 DIAGNOSIS — N179 Acute kidney failure, unspecified: Secondary | ICD-10-CM | POA: Diagnosis present

## 2016-11-08 DIAGNOSIS — M810 Age-related osteoporosis without current pathological fracture: Secondary | ICD-10-CM | POA: Diagnosis present

## 2016-11-08 DIAGNOSIS — R52 Pain, unspecified: Secondary | ICD-10-CM | POA: Diagnosis not present

## 2016-11-08 DIAGNOSIS — E860 Dehydration: Secondary | ICD-10-CM | POA: Diagnosis present

## 2016-11-08 DIAGNOSIS — Z8781 Personal history of (healed) traumatic fracture: Secondary | ICD-10-CM | POA: Diagnosis not present

## 2016-11-08 DIAGNOSIS — G8929 Other chronic pain: Secondary | ICD-10-CM | POA: Diagnosis present

## 2016-11-08 DIAGNOSIS — Z886 Allergy status to analgesic agent status: Secondary | ICD-10-CM | POA: Diagnosis not present

## 2016-11-08 DIAGNOSIS — E876 Hypokalemia: Secondary | ICD-10-CM | POA: Diagnosis not present

## 2016-11-08 DIAGNOSIS — M549 Dorsalgia, unspecified: Secondary | ICD-10-CM

## 2016-11-08 DIAGNOSIS — A419 Sepsis, unspecified organism: Principal | ICD-10-CM | POA: Diagnosis present

## 2016-11-08 DIAGNOSIS — I10 Essential (primary) hypertension: Secondary | ICD-10-CM | POA: Diagnosis present

## 2016-11-08 DIAGNOSIS — N390 Urinary tract infection, site not specified: Secondary | ICD-10-CM | POA: Diagnosis present

## 2016-11-08 DIAGNOSIS — G9341 Metabolic encephalopathy: Secondary | ICD-10-CM | POA: Diagnosis present

## 2016-11-08 DIAGNOSIS — Z791 Long term (current) use of non-steroidal anti-inflammatories (NSAID): Secondary | ICD-10-CM

## 2016-11-08 DIAGNOSIS — M546 Pain in thoracic spine: Secondary | ICD-10-CM | POA: Diagnosis present

## 2016-11-08 DIAGNOSIS — Z888 Allergy status to other drugs, medicaments and biological substances status: Secondary | ICD-10-CM | POA: Diagnosis not present

## 2016-11-08 DIAGNOSIS — S300XXA Contusion of lower back and pelvis, initial encounter: Secondary | ICD-10-CM | POA: Diagnosis present

## 2016-11-08 DIAGNOSIS — R55 Syncope and collapse: Secondary | ICD-10-CM | POA: Diagnosis present

## 2016-11-08 DIAGNOSIS — R4182 Altered mental status, unspecified: Secondary | ICD-10-CM | POA: Diagnosis present

## 2016-11-08 HISTORY — DX: Pneumonia, unspecified organism: J18.9

## 2016-11-08 HISTORY — DX: Other chronic pain: G89.29

## 2016-11-08 HISTORY — DX: Urinary tract infection, site not specified: N39.0

## 2016-11-08 HISTORY — DX: Unspecified osteoarthritis, unspecified site: M19.90

## 2016-11-08 HISTORY — DX: Dorsalgia, unspecified: M54.9

## 2016-11-08 HISTORY — DX: Unspecified dementia, unspecified severity, without behavioral disturbance, psychotic disturbance, mood disturbance, and anxiety: F03.90

## 2016-11-08 HISTORY — DX: Fibromyalgia: M79.7

## 2016-11-08 HISTORY — DX: Anxiety disorder, unspecified: F41.9

## 2016-11-08 HISTORY — DX: Esophageal obstruction: K22.2

## 2016-11-08 HISTORY — DX: Unspecified malignant neoplasm of skin, unspecified: C44.90

## 2016-11-08 HISTORY — DX: Essential (primary) hypertension: I10

## 2016-11-08 LAB — COMPREHENSIVE METABOLIC PANEL
ALBUMIN: 3.3 g/dL — AB (ref 3.5–5.0)
ALT: 37 U/L (ref 14–54)
ANION GAP: 12 (ref 5–15)
AST: 53 U/L — ABNORMAL HIGH (ref 15–41)
Alkaline Phosphatase: 49 U/L (ref 38–126)
BUN: 39 mg/dL — ABNORMAL HIGH (ref 6–20)
CHLORIDE: 108 mmol/L (ref 101–111)
CO2: 19 mmol/L — AB (ref 22–32)
Calcium: 8.7 mg/dL — ABNORMAL LOW (ref 8.9–10.3)
Creatinine, Ser: 1.23 mg/dL — ABNORMAL HIGH (ref 0.44–1.00)
GFR calc Af Amer: 45 mL/min — ABNORMAL LOW (ref >60)
GFR calc non Af Amer: 39 mL/min — ABNORMAL LOW (ref >60)
GLUCOSE: 164 mg/dL — AB (ref 65–99)
POTASSIUM: 3.6 mmol/L (ref 3.5–5.1)
SODIUM: 139 mmol/L (ref 135–145)
Total Bilirubin: 1.2 mg/dL (ref 0.3–1.2)
Total Protein: 6.3 g/dL — ABNORMAL LOW (ref 6.5–8.1)

## 2016-11-08 LAB — URINALYSIS, ROUTINE W REFLEX MICROSCOPIC
BILIRUBIN URINE: NEGATIVE
Glucose, UA: NEGATIVE mg/dL
Ketones, ur: 15 mg/dL — AB
Nitrite: POSITIVE — AB
PH: 6 (ref 5.0–8.0)
Protein, ur: NEGATIVE mg/dL
SPECIFIC GRAVITY, URINE: 1.015 (ref 1.005–1.030)

## 2016-11-08 LAB — CBC WITH DIFFERENTIAL/PLATELET
Basophils Absolute: 0 10*3/uL (ref 0.0–0.1)
Basophils Relative: 0 %
Eosinophils Absolute: 0 10*3/uL (ref 0.0–0.7)
Eosinophils Relative: 0 %
HEMATOCRIT: 39 % (ref 36.0–46.0)
HEMOGLOBIN: 13.2 g/dL (ref 12.0–15.0)
LYMPHS ABS: 1.8 10*3/uL (ref 0.7–4.0)
Lymphocytes Relative: 17 %
MCH: 29.5 pg (ref 26.0–34.0)
MCHC: 33.8 g/dL (ref 30.0–36.0)
MCV: 87.2 fL (ref 78.0–100.0)
MONOS PCT: 7 %
Monocytes Absolute: 0.7 10*3/uL (ref 0.1–1.0)
NEUTROS ABS: 8.3 10*3/uL — AB (ref 1.7–7.7)
NEUTROS PCT: 76 %
Platelets: 203 10*3/uL (ref 150–400)
RBC: 4.47 MIL/uL (ref 3.87–5.11)
RDW: 14 % (ref 11.5–15.5)
WBC: 10.9 10*3/uL — ABNORMAL HIGH (ref 4.0–10.5)

## 2016-11-08 LAB — URINALYSIS, MICROSCOPIC (REFLEX)

## 2016-11-08 LAB — I-STAT TROPONIN, ED: Troponin i, poc: 0.03 ng/mL (ref 0.00–0.08)

## 2016-11-08 LAB — I-STAT CG4 LACTIC ACID, ED: LACTIC ACID, VENOUS: 1.76 mmol/L (ref 0.5–1.9)

## 2016-11-08 LAB — CK: CK TOTAL: 528 U/L — AB (ref 38–234)

## 2016-11-08 MED ORDER — PHENAZOPYRIDINE HCL 100 MG PO TABS
95.0000 mg | ORAL_TABLET | Freq: Three times a day (TID) | ORAL | Status: DC | PRN
  Filled 2016-11-08: qty 1

## 2016-11-08 MED ORDER — SODIUM CHLORIDE 0.9 % IV BOLUS (SEPSIS)
500.0000 mL | Freq: Once | INTRAVENOUS | Status: AC
  Administered 2016-11-08: 500 mL via INTRAVENOUS

## 2016-11-08 MED ORDER — ASPIRIN 325 MG PO TABS
325.0000 mg | ORAL_TABLET | Freq: Every day | ORAL | Status: DC
  Administered 2016-11-10 – 2016-11-12 (×3): 325 mg via ORAL
  Filled 2016-11-08 (×3): qty 1

## 2016-11-08 MED ORDER — PIPERACILLIN-TAZOBACTAM 3.375 G IVPB 30 MIN
3.3750 g | Freq: Once | INTRAVENOUS | Status: AC
  Administered 2016-11-08: 3.375 g via INTRAVENOUS
  Filled 2016-11-08: qty 50

## 2016-11-08 MED ORDER — VANCOMYCIN HCL IN DEXTROSE 1-5 GM/200ML-% IV SOLN
1000.0000 mg | Freq: Once | INTRAVENOUS | Status: AC
  Administered 2016-11-08: 1000 mg via INTRAVENOUS
  Filled 2016-11-08: qty 200

## 2016-11-08 MED ORDER — GUAIFENESIN 200 MG PO TABS
400.0000 mg | ORAL_TABLET | ORAL | Status: DC
  Administered 2016-11-09 – 2016-11-12 (×16): 400 mg via ORAL
  Filled 2016-11-08 (×23): qty 2

## 2016-11-08 MED ORDER — ACETAMINOPHEN 500 MG PO TABS: 500.0000 mg | ORAL_TABLET | Freq: Four times a day (QID) | ORAL | Status: DC | PRN

## 2016-11-08 MED ORDER — PAROXETINE HCL 10 MG PO TABS
10.0000 mg | ORAL_TABLET | Freq: Every day | ORAL | Status: DC
  Administered 2016-11-10 – 2016-11-12 (×3): 10 mg via ORAL
  Filled 2016-11-08 (×4): qty 1

## 2016-11-08 MED ORDER — DONEPEZIL HCL 10 MG PO TABS
10.0000 mg | ORAL_TABLET | Freq: Every day | ORAL | Status: DC
  Administered 2016-11-09 – 2016-11-11 (×3): 10 mg via ORAL
  Filled 2016-11-08 (×3): qty 1

## 2016-11-08 NOTE — ED Triage Notes (Signed)
Patient arrives via EMS from home after EMS called by family. Per EMS patient was last seen by family on Wednesday. Today and yesterday, family was trying to reach patient by phone without response. When family went to check on patient today they found patient lying on her back on kitchen floor. Time down unknown. After family arrival they reported that patient was seen on Thursday by neighbor. Arrives with soiled clothing. Foul urine odor noted. Patient disoriented as to location, but able to state name. When asked patient states that she fell today.

## 2016-11-08 NOTE — H&P (Signed)
History and Physical    Alicia Roth RDE:[id number] DOB: [birth year] DOA: 11/08/2016  PCP: Alicia Downing, MD Patient coming from: Home  Chief Complaint: Found down  HPI: Alicia Roth is a 83 y.o. female with medical history significant of osteoporosis, chronic arthralgia/pain, dementia, HTN, depression who presents after being found down.  Alicia Roth was last seen normal on Thursday by one of her sons.  She noted at that time that she had not felt well for the entire week.  She was seen also by a neighbor on Thursday.  She was not seen again for a few days.  Family became worried when they could not reach her and they made entry into her house today and found her down in the kitchen, moaning and confused.  She had multiple bruises and contusions and was complaining of pain.  She has presumed dementia and memory issues at baseline, she is on donepazil.  However, per daughter Alicia Roth, she does not take her medications regularly and will often forget them.  It is not clear what led up to her being found down, but she has now been found to have an abnormal UA and acute renal failure.  She has a large family with multiple children.  Daughters Alicia Roth and Alicia Roth were present in room and supplied some of the history.  The patient was awake to voice, but would not open eyes.  She answered some questions, but was confused.  Reported being in pain but could not tell me where.    ED Course: In the ED, she was found to have a BUN of 39, Cr of 1.23 (up from 21 and 0.9 in May), AST of 53, CK of 528, WBC of 10.9.  UA was nitrite +.  Lactic acid was 1.76.  Her BP has been well controlled, however, she had a temperature of 101.4 and tachycardia that has slowly improved with fluids.    Review of Systems: As per HPI otherwise 10 point review of systems negative.   Past Medical History:  Diagnosis Date  . Chronic pain syndrome   . Colitis   . Depression   . Hypertension   . Osteoporosis   . S/P dilatation of  esophageal stricture     Past Surgical History:  Procedure Laterality Date  . APPENDECTOMY    . BACK SURGERY     x3  . CARDIOVASCULAR STRESS TEST  11/20/2009   ef 76%, no ischemia  . CHOLECYSTECTOMY    . SHOULDER SURGERY    . VEIN LIGATION AND STRIPPING     Reviewed with family.   reports that she has never smoked. She does not have any smokeless tobacco history on file. She reports that she does not drink alcohol or use drugs.  Allergies  Allergen Reactions  . Codeine     unknown  . Diazepam     unknown  . Gabapentin     unknown  . Indomethacin     unknown   Reviewed with family.  She did not know her mother as she passed when she was 89 years old.  Family History  Problem Relation Age of Onset  . CAD Father   . Heart failure Sister   . Rheumatic fever Brother     Prior to Admission medications   Medication Sig Start Date End Date Taking? Authorizing Provider  acetaminophen (TYLENOL) 500 MG tablet Take 500 mg by mouth every 6 (six) hours as needed for mild pain.   Yes [provider]  aspirin 325 MG tablet Take 325 mg by mouth daily.   Yes [provider]  donepezil (ARICEPT) 10 MG tablet Take 10 mg by mouth at bedtime.  09/03/16  Yes [provider]  guaifenesin (HUMIBID E) 400 MG TABS tablet Take 400 mg by mouth every 4 (four) hours.   Yes [provider]  lisinopril (PRINIVIL,ZESTRIL) 20 MG tablet Take 20 mg by mouth daily.   Yes [provider]  naproxen sodium (ANAPROX) 220 MG tablet Take 220 mg by mouth 2 (two) times daily with a meal.   Yes [provider]  PARoxetine (PAXIL) 20 MG tablet Take 10 mg by mouth daily.   Yes [provider]  phenazopyridine (URISTAT) 95 MG tablet Take 95 mg by mouth 3 (three) times daily as needed for pain.   Yes [provider]  sodium chloride (OCEAN) 0.65 % SOLN nasal spray Place 1 spray into both nostrils as needed for congestion.   Yes [provider]    OVER THE COUNTER MEDICATION Take 1 tablet by mouth daily. Over the counter vitamin for bones    [provider]    Physical Exam: Vitals:   11/08/16 2230 11/08/16 2245 11/08/16 2300 11/08/16 2315  BP: 135/69 136/68 (!) 141/71 136/71  Pulse: (!) 103 96 99 96  Resp: (!) 28 15 (!) 24 (!) 9  Temp:      TempSrc:      SpO2: 93% 95% 96% 93%    Constitutional: Lying in bed, does not appear to be in acute distress.  Vitals:   11/08/16 2230 11/08/16 2245 11/08/16 2300 11/08/16 2315  BP: 135/69 136/68 (!) 141/71 136/71  Pulse: (!) 103 96 99 96  Resp: (!) 28 15 (!) 24 (!) 9  Temp:      TempSrc:      SpO2: 93% 95% 96% 93%   Eyes: PERRL, lids with some crusting at eyelids, bruising around right eye.  She would not open her eyes voluntarily, but upon manual opening she has normal sclerae.  ENMT: Mucous membranes are very dry.  She has some dried reddish discharge in her posterior oropharynx.  Dentures in place.   Neck: No swelling, lymphadenopathy.  She had a towel around neck from EMS.   Respiratory: clear to auscultation anteriorly, no wheezing, no crackles. Normal respiratory effort. Cardiovascular: tachycardic rate and regular rhythm, no murmurs / rubs / gallops. No extremity edema.  Abdomen: no tenderness, no masses palpated. Bowel sounds positive.  Musculoskeletal: no clubbing / cyanosis. No joint deformity upper and lower extremities. . Normal muscle tone for age.  Skin: She has a large non blanching area on her sacrum with bruising at the center.  She has linear bruising on left thigh.  She has some erythema and mild rash to bilateral feet.  Her skin is warm to the touch.  She has varicosities in both arms and legs.  Neurologic: She would wiggle her toes to command.  She squeezed my fingers.  She smiled.  Otherwise, could not get much cooperation given mental status.  Psychiatric: Awake and answering some questions, would not open eyes.   Labs on Admission: I have personally  reviewed following labs and imaging studies  CBC:  Recent Labs Lab 11/08/16 2100  WBC 10.9*  NEUTROABS 8.3*  HGB 13.2  HCT 39.0  MCV 87.2  PLT 161   Basic Metabolic Panel:  Recent Labs Lab 11/08/16 2100  NA 139  K 3.6  CL 108  CO2 19*  GLUCOSE 164*  BUN 39*  CREATININE 1.23*  CALCIUM 8.7*   GFR: CrCl cannot be calculated (Unknown ideal weight.). Liver Function Tests:  Recent Labs Lab 11/08/16 2100  AST 53*  ALT 37  ALKPHOS 49  BILITOT 1.2  PROT 6.3*  ALBUMIN 3.3*   No results for input(s): LIPASE, AMYLASE in the last 168 hours. No results for input(s): AMMONIA in the last 168 hours. Coagulation Profile: No results for input(s): INR, PROTIME in the last 168 hours. Cardiac Enzymes:  Recent Labs Lab 11/08/16 2100  CKTOTAL 528*   BNP (last 3 results) No results for input(s): PROBNP in the last 8760 hours. HbA1C: No results for input(s): HGBA1C in the last 72 hours. CBG: No results for input(s): GLUCAP in the last 168 hours. Lipid Profile: No results for input(s): CHOL, HDL, LDLCALC, TRIG, CHOLHDL, LDLDIRECT in the last 72 hours. Thyroid Function Tests: No results for input(s): TSH, T4TOTAL, FREET4, T3FREE, THYROIDAB in the last 72 hours. Anemia Panel: No results for input(s): VITAMINB12, FOLATE, FERRITIN, TIBC, IRON, RETICCTPCT in the last 72 hours. Urine analysis:    Component Value Date/Time   COLORURINE YELLOW 11/08/2016 2101   APPEARANCEUR TURBID (A) 11/08/2016 2101   LABSPEC 1.015 11/08/2016 2101   PHURINE 6.0 11/08/2016 2101   GLUCOSEU NEGATIVE 11/08/2016 2101   HGBUR LARGE (A) 11/08/2016 2101   BILIRUBINUR NEGATIVE 11/08/2016 2101   KETONESUR 15 (A) 11/08/2016 2101   PROTEINUR NEGATIVE 11/08/2016 2101   UROBILINOGEN 0.2 05/30/2014 2308   NITRITE POSITIVE (A) 11/08/2016 2101   LEUKOCYTESUR LARGE (A) 11/08/2016 2101    Radiological Exams on Admission: Dg Chest 1 View  Result Date: 11/08/2016 CLINICAL DATA:  Acute onset of fever.   Initial encounter. EXAM: CHEST 1 VIEW COMPARISON:  Chest radiograph performed 05/30/2014 FINDINGS: The lungs are well-aerated and clear. There is no evidence of focal opacification, pleural effusion or pneumothorax. The cardiomediastinal silhouette is borderline enlarged. No acute osseous abnormalities are seen. Thoracolumbar spinal fusion hardware is noted. The patient's right shoulder arthroplasty appears grossly unremarkable. Degenerative change is noted at the left glenohumeral joint. IMPRESSION: Borderline cardiomegaly.  Lungs remain grossly clear. Electronically Signed   By: Garald Balding M.D.   On: 11/08/2016 21:42   Ct Head Wo Contrast  Result Date: 11/08/2016 CLINICAL DATA:  Patient found down on floor more so L2 clothing. Patient is disoriented. EXAM: CT HEAD WITHOUT CONTRAST CT CERVICAL SPINE WITHOUT CONTRAST TECHNIQUE: Multidetector CT imaging of the head and cervical spine was performed following the standard protocol without intravenous contrast. Multiplanar CT image reconstructions of the cervical spine were also generated. COMPARISON:  08/20/2006 CT. FINDINGS: CT HEAD FINDINGS Brain: Sulcal and ventricular prominence in keeping with chronic atrophy. No acute intracranial hemorrhage, midline shift or edema. No large vascular territory infarction. No evidence hydrocephalus. Midline fourth ventricle and basal cisterns. Vascular: No hyperdense vessel or unexpected calcification. Moderate atherosclerosis of the carotid siphons. Skull: No skull fracture Sinuses/Orbits: Intact orbits and globes.  No acute sinus disease. Other: Scalp contusion overlying the occiput. CT CERVICAL SPINE FINDINGS Alignment: Minimal grade 1 anterolisthesis of T1 on T2, likely on the basis of degenerative facet arthropathy. No posttraumatic malalignment. Skull base and vertebrae: Negative for skull or cervical spine fracture. Soft tissues and spinal canal: No prevertebral fluid or swelling. No visible canal hematoma. Disc  levels: Diffuse disc space narrowing and facet arthropathy most prominent from C3 through C7 with small posterior disc - osteophyte complexes. No jumped or perched facets. Upper chest:  Negative. Other: None IMPRESSION: 1. Chronic stable cerebral atrophy without acute intracranial abnormality or skull fracture. 2. Small occipital scalp contusion. 3. Cervical spondylosis without acute posttraumatic fracture or subluxations. Electronically Signed   By: Ashley Royalty M.D.   On: 11/08/2016 22:57   Ct Cervical Spine Wo Contrast  Result Date: 11/08/2016 CLINICAL DATA:  Patient found down on floor more so L2 clothing. Patient is disoriented. EXAM: CT HEAD WITHOUT CONTRAST CT CERVICAL SPINE WITHOUT CONTRAST TECHNIQUE: Multidetector CT imaging of the head and cervical spine was performed following the standard protocol without intravenous contrast. Multiplanar CT image reconstructions of the cervical spine were also generated. COMPARISON:  08/20/2006 CT. FINDINGS: CT HEAD FINDINGS Brain: Sulcal and ventricular prominence in keeping with chronic atrophy. No acute intracranial hemorrhage, midline shift or edema. No large vascular territory infarction. No evidence hydrocephalus. Midline fourth ventricle and basal cisterns. Vascular: No hyperdense vessel or unexpected calcification. Moderate atherosclerosis of the carotid siphons. Skull: No skull fracture Sinuses/Orbits: Intact orbits and globes.  No acute sinus disease. Other: Scalp contusion overlying the occiput. CT CERVICAL SPINE FINDINGS Alignment: Minimal grade 1 anterolisthesis of T1 on T2, likely on the basis of degenerative facet arthropathy. No posttraumatic malalignment. Skull base and vertebrae: Negative for skull or cervical spine fracture. Soft tissues and spinal canal: No prevertebral fluid or swelling. No visible canal hematoma. Disc levels: Diffuse disc space narrowing and facet arthropathy most prominent from C3 through C7 with small posterior disc -  osteophyte complexes. No jumped or perched facets. Upper chest: Negative. Other: None IMPRESSION: 1. Chronic stable cerebral atrophy without acute intracranial abnormality or skull fracture. 2. Small occipital scalp contusion. 3. Cervical spondylosis without acute posttraumatic fracture or subluxations. Electronically Signed   By: Ashley Royalty M.D.   On: 11/08/2016 22:57   Dg Hip Unilat W Or Wo Pelvis 2-3 Views Left  Result Date: 11/08/2016 CLINICAL DATA:  Found down, lying on back on kitchen floor. Initial encounter. EXAM: DG HIP (WITH OR WITHOUT PELVIS) 2-3V LEFT COMPARISON:  None. FINDINGS: There is no evidence of fracture or dislocation. Both femoral heads are seated normally within their respective acetabula. The proximal left femur appears intact. No significant degenerative change is appreciated. The sacroiliac joints are unremarkable in appearance. Visualized lumbar spinal fusion hardware is grossly unremarkable. The visualized bowel gas pattern is grossly unremarkable in appearance. IMPRESSION: No evidence of fracture or dislocation. Electronically Signed   By: Garald Balding M.D.   On: 11/08/2016 21:43    EKG: Independently reviewed. Tachycardic, sinus, low voltage.  No ST changes.   Assessment/Plan Sepsis due to urinary tract infection - Sepsis critera: Elevated temperature, tachycardia, source of urine - She was treated with Zosyn and Vanc in the ED, but I switched to Rocephin to cover common urinary pathogens.  There is no other clear source of infection - BC pending, UC pending - CXR grossly clear - IVF with NS at 75cc/hr - Monitor on telemetry given tachycardia - Check TSH - NPO - Trend lactate  Fall, found down - CT head and neck with no acute fractures, small contusion on back of head - Given she cannot give much history and family unaware of last 48-72 hours, she could have had syncope - Monitor on telemetry - Trend enzymes - AM EKG - AST mildly elevated, check tylenol  level.  She is on aspirin, check salicylate level.  I did not find any recent Rx for hydrocodone in the [place] narcotic database.   AKI, elevated CK -  Trend CK - She received 1 L of NS in the ED - IVF with NS at 75cc/hr - Trend Cr  Back wound - Likely from being found down, falling - Wound care    Dementia - Presumed diagnosis per family, continue donepezil     Hypertension - Hold lisinopril in acute infectious setting - Monitor blood pressure.     Depression - Continue paxil.   Osteoporosis - No apparent fractures based on initial imaging.  If she reports worsening pain upon awakening, would consider xrays to ensure no missed fractures.   - Tramadol for pain if needed.    DVT prophylaxis: Lovenox Code Status: Full, discussed at length with family.  They had not discussed with patient and felt more comfortable with full code once explained to them.  This should certainly be readressed.  Family Communication: Daughters Alicia Roth and Alicia Roth at bedside Disposition Plan: Admit for treatment, anticipate discharge in 2-3 days.  Would order PT/OT when patient able to participate Consults called: None Admission status: Telemetry, inpatient   Gilles Chiquito MD [facility] Pager [phone]  If 7PM-7AM, please contact night-coverage [email or web address] Password [facility]  11/09/2016, 12:07 AM

## 2016-11-08 NOTE — ED Provider Notes (Signed)
[facility] DEPT Provider Note   CSN: [id number] Arrival date & time: 11/08/16  2025     History   Chief Complaint Chief Complaint  Patient presents with  . Fall  . Altered Mental Status    HPI Alicia Roth is a 83 y.o. female.  HPI Patient found on floor by family members. Altered mental status. Unable to contribute to history. Level V caveat applies. Per family patient lives alone. Last seen at her baseline was on Thursday. Found patient lying on the floor and appeared to have had several falls. She was confused and minimally responsive. EMS was called. Past Medical History:  Diagnosis Date  . Chronic pain syndrome   . Colitis   . Depression   . Hypertension   . Osteoporosis   . S/P dilatation of esophageal stricture     Patient Active Problem List   Diagnosis Date Noted  . Osteoporosis   . Hypertension   . Depression   . AKI (acute kidney injury) ([place])   . Dehydration   . Lower urinary tract infectious disease   . Sepsis due to urinary tract infection ([place]) 11/08/2016    Past Surgical History:  Procedure Laterality Date  . APPENDECTOMY    . BACK SURGERY     x3  . CARDIOVASCULAR STRESS TEST  11/20/2009   ef 76%, no ischemia  . CHOLECYSTECTOMY    . SHOULDER SURGERY    . VEIN LIGATION AND STRIPPING      OB History    No data available       Home Medications    Prior to Admission medications   Medication Sig Start Date End Date Taking? Authorizing Provider  acetaminophen (TYLENOL) 500 MG tablet Take 500 mg by mouth every 6 (six) hours as needed for mild pain.   Yes [provider]  aspirin 325 MG tablet Take 325 mg by mouth daily.   Yes [provider]  donepezil (ARICEPT) 10 MG tablet Take 10 mg by mouth at bedtime.  09/03/16  Yes [provider]  guaifenesin (HUMIBID E) 400 MG TABS tablet Take 400 mg by mouth every 4 (four) hours.   Yes [provider]  lisinopril (PRINIVIL,ZESTRIL) 20 MG tablet Take 20 mg by  mouth daily.   Yes [provider]  naproxen sodium (ANAPROX) 220 MG tablet Take 220 mg by mouth 2 (two) times daily with a meal.   Yes [provider]  PARoxetine (PAXIL) 20 MG tablet Take 10 mg by mouth daily.   Yes [provider]  phenazopyridine (URISTAT) 95 MG tablet Take 95 mg by mouth 3 (three) times daily as needed for pain.   Yes [provider]  sodium chloride (OCEAN) 0.65 % SOLN nasal spray Place 1 spray into both nostrils as needed for congestion.   Yes [provider]  OVER THE COUNTER MEDICATION Take 1 tablet by mouth daily. Over the counter vitamin for bones    [provider]    Family History Family History  Problem Relation Age of Onset  . CAD Father   . Heart failure Sister   . Rheumatic fever Brother     Social History Social History  Substance Use Topics  . Smoking status: Never Smoker  . Smokeless tobacco: Not on file  . Alcohol use No     Allergies   Codeine; Diazepam; Gabapentin; and Indomethacin   Review of Systems Review of Systems  Unable to perform ROS: Mental status change  Physical Exam Updated Vital Signs BP (!) 144/61 (BP Location: Right Arm)   Pulse 69   Temp 98.5 F (36.9 C) (Oral)   Resp 20   Ht 5\' 5"  (1.651 m)   Wt 68.9 kg (151 lb 14.4 oz)   SpO2 94%   BMI 25.28 kg/m   Physical Exam  Constitutional: She appears well-developed and well-nourished.  Smells of urine  HENT:  Head: Normocephalic and atraumatic.  Dry mucous membranes. No obvious head trauma.  Eyes: Pupils are equal, round, and reactive to light. EOM are normal.  Neck:  No obvious meningismus.  Cardiovascular: Regular rhythm.   Tachycardia  Pulmonary/Chest:  Increased respiratory effort. Few scattered rhonchi.  Abdominal: Soft. Bowel sounds are normal. There is no tenderness. There is no rebound and no guarding.  Musculoskeletal: Normal range of motion. She exhibits no edema or tenderness.  Patient  hasn't tenderness to palpation over the left hip. Appears to be some skin contusion present. Distal pulses are 2+. Patient has mild diffuse thoracic and lumbar tenderness to palpation. No lower extremity swelling or asymmetry.  Neurological:  Patient is confused and mumbling incoherently. Intermittent the following simple commands. Appears to be moving all extremities without any focal deficit. Sensation is grossly intact.  Skin: Skin is warm and dry. No rash noted. No erythema.  Patient with erythema and hyperpigmentation to the back mostly located over the sacral region.  Nursing note and vitals reviewed.    ED Treatments / Results  Labs (all labs ordered are listed, but only abnormal results are displayed) Labs Reviewed  COMPREHENSIVE METABOLIC PANEL - Abnormal; Notable for the following:       Result Value   CO2 19 (*)    Glucose, Bld 164 (*)    BUN 39 (*)    Creatinine, Ser 1.23 (*)    Calcium 8.7 (*)    Total Protein 6.3 (*)    Albumin 3.3 (*)    AST 53 (*)    GFR calc non Af Amer 39 (*)    GFR calc Af Amer 45 (*)    All other components within normal limits  CBC WITH DIFFERENTIAL/PLATELET - Abnormal; Notable for the following:    WBC 10.9 (*)    Neutro Abs 8.3 (*)    All other components within normal limits  URINALYSIS, ROUTINE W REFLEX MICROSCOPIC - Abnormal; Notable for the following:    APPearance TURBID (*)    Hgb urine dipstick LARGE (*)    Ketones, ur 15 (*)    Nitrite POSITIVE (*)    Leukocytes, UA LARGE (*)    All other components within normal limits  CK - Abnormal; Notable for the following:    Total CK 528 (*)    All other components within normal limits  URINALYSIS, MICROSCOPIC (REFLEX) - Abnormal; Notable for the following:    Bacteria, UA MANY (*)    Squamous Epithelial / LPF 6-30 (*)    All other components within normal limits  CBC - Abnormal; Notable for the following:    WBC 11.1 (*)    All other components within normal limits  COMPREHENSIVE  METABOLIC PANEL - Abnormal; Notable for the following:    Chloride 112 (*)    Glucose, Bld 150 (*)    BUN 36 (*)    Creatinine, Ser 1.09 (*)    Calcium 8.4 (*)    Total Protein 5.7 (*)    Albumin 3.0 (*)    AST 47 (*)    GFR  calc non Af Amer 45 (*)    GFR calc Af Amer 52 (*)    All other components within normal limits  CK - Abnormal; Notable for the following:    Total CK 505 (*)    All other components within normal limits  TROPONIN I - Abnormal; Notable for the following:    Troponin I 0.03 (*)    All other components within normal limits  TROPONIN I - Abnormal; Notable for the following:    Troponin I 0.03 (*)    All other components within normal limits  CULTURE, BLOOD (ROUTINE X 2)  CULTURE, BLOOD (ROUTINE X 2)  URINE CULTURE  RAPID URINE DRUG SCREEN, HOSP PERFORMED  TSH  LACTIC ACID, PLASMA  LACTIC ACID, PLASMA  TROPONIN I  I-STAT CG4 LACTIC ACID, ED  I-STAT TROPONIN, ED    EKG  EKG Interpretation  Date/Time:  Saturday November 08 2016 20:36:38 EDT Ventricular Rate:  116 PR Interval:    QRS Duration: 81 QT Interval:  339 QTC Calculation: 471 R Axis:   41 Text Interpretation:  Age not entered, assumed to be  83 years old for purpose of ECG interpretation Sinus tachycardia Borderline low voltage, extremity leads Confirmed by Lita Mains  MD, Brittanni Cariker ([id number]) on 11/09/2016 2:44:21 PM       Radiology Dg Chest 1 View  Result Date: 11/08/2016 CLINICAL DATA:  Acute onset of fever.  Initial encounter. EXAM: CHEST 1 VIEW COMPARISON:  Chest radiograph performed 05/30/2014 FINDINGS: The lungs are well-aerated and clear. There is no evidence of focal opacification, pleural effusion or pneumothorax. The cardiomediastinal silhouette is borderline enlarged. No acute osseous abnormalities are seen. Thoracolumbar spinal fusion hardware is noted. The patient's right shoulder arthroplasty appears grossly unremarkable. Degenerative change is noted at the left glenohumeral joint.  IMPRESSION: Borderline cardiomegaly.  Lungs remain grossly clear. Electronically Signed   By: Garald Balding M.D.   On: 11/08/2016 21:42   Ct Head Wo Contrast  Result Date: 11/08/2016 CLINICAL DATA:  Patient found down on floor more so L2 clothing. Patient is disoriented. EXAM: CT HEAD WITHOUT CONTRAST CT CERVICAL SPINE WITHOUT CONTRAST TECHNIQUE: Multidetector CT imaging of the head and cervical spine was performed following the standard protocol without intravenous contrast. Multiplanar CT image reconstructions of the cervical spine were also generated. COMPARISON:  08/20/2006 CT. FINDINGS: CT HEAD FINDINGS Brain: Sulcal and ventricular prominence in keeping with chronic atrophy. No acute intracranial hemorrhage, midline shift or edema. No large vascular territory infarction. No evidence hydrocephalus. Midline fourth ventricle and basal cisterns. Vascular: No hyperdense vessel or unexpected calcification. Moderate atherosclerosis of the carotid siphons. Skull: No skull fracture Sinuses/Orbits: Intact orbits and globes.  No acute sinus disease. Other: Scalp contusion overlying the occiput. CT CERVICAL SPINE FINDINGS Alignment: Minimal grade 1 anterolisthesis of T1 on T2, likely on the basis of degenerative facet arthropathy. No posttraumatic malalignment. Skull base and vertebrae: Negative for skull or cervical spine fracture. Soft tissues and spinal canal: No prevertebral fluid or swelling. No visible canal hematoma. Disc levels: Diffuse disc space narrowing and facet arthropathy most prominent from C3 through C7 with small posterior disc - osteophyte complexes. No jumped or perched facets. Upper chest: Negative. Other: None IMPRESSION: 1. Chronic stable cerebral atrophy without acute intracranial abnormality or skull fracture. 2. Small occipital scalp contusion. 3. Cervical spondylosis without acute posttraumatic fracture or subluxations. Electronically Signed   By: Ashley Royalty M.D.   On: 11/08/2016 22:57    Ct Cervical Spine Wo Contrast  Result  Date: 11/08/2016 CLINICAL DATA:  Patient found down on floor more so L2 clothing. Patient is disoriented. EXAM: CT HEAD WITHOUT CONTRAST CT CERVICAL SPINE WITHOUT CONTRAST TECHNIQUE: Multidetector CT imaging of the head and cervical spine was performed following the standard protocol without intravenous contrast. Multiplanar CT image reconstructions of the cervical spine were also generated. COMPARISON:  08/20/2006 CT. FINDINGS: CT HEAD FINDINGS Brain: Sulcal and ventricular prominence in keeping with chronic atrophy. No acute intracranial hemorrhage, midline shift or edema. No large vascular territory infarction. No evidence hydrocephalus. Midline fourth ventricle and basal cisterns. Vascular: No hyperdense vessel or unexpected calcification. Moderate atherosclerosis of the carotid siphons. Skull: No skull fracture Sinuses/Orbits: Intact orbits and globes.  No acute sinus disease. Other: Scalp contusion overlying the occiput. CT CERVICAL SPINE FINDINGS Alignment: Minimal grade 1 anterolisthesis of T1 on T2, likely on the basis of degenerative facet arthropathy. No posttraumatic malalignment. Skull base and vertebrae: Negative for skull or cervical spine fracture. Soft tissues and spinal canal: No prevertebral fluid or swelling. No visible canal hematoma. Disc levels: Diffuse disc space narrowing and facet arthropathy most prominent from C3 through C7 with small posterior disc - osteophyte complexes. No jumped or perched facets. Upper chest: Negative. Other: None IMPRESSION: 1. Chronic stable cerebral atrophy without acute intracranial abnormality or skull fracture. 2. Small occipital scalp contusion. 3. Cervical spondylosis without acute posttraumatic fracture or subluxations. Electronically Signed   By: Ashley Royalty M.D.   On: 11/08/2016 22:57   Dg Hip Unilat W Or Wo Pelvis 2-3 Views Left  Result Date: 11/08/2016 CLINICAL DATA:  Found down, lying on back on kitchen  floor. Initial encounter. EXAM: DG HIP (WITH OR WITHOUT PELVIS) 2-3V LEFT COMPARISON:  None. FINDINGS: There is no evidence of fracture or dislocation. Both femoral heads are seated normally within their respective acetabula. The proximal left femur appears intact. No significant degenerative change is appreciated. The sacroiliac joints are unremarkable in appearance. Visualized lumbar spinal fusion hardware is grossly unremarkable. The visualized bowel gas pattern is grossly unremarkable in appearance. IMPRESSION: No evidence of fracture or dislocation. Electronically Signed   By: Garald Balding M.D.   On: 11/08/2016 21:43    Procedures Procedures (including critical care time)  Medications Ordered in ED Medications  phenazopyridine (PYRIDIUM) tablet 100 mg (not administered)  PARoxetine (PAXIL) tablet 10 mg (10 mg Oral Not Given 11/09/16 1000)  aspirin tablet 325 mg (325 mg Oral Not Given 11/09/16 1000)  donepezil (ARICEPT) tablet 10 mg (0 mg Oral Hold 11/08/16 2330)  guaiFENesin tablet 400 mg (400 mg Oral Given 11/09/16 1334)  sodium chloride flush (NS) 0.9 % injection 3 mL (3 mLs Intravenous Given 11/09/16 1000)  0.9 %  sodium chloride infusion ( Intravenous New Bag/Given 11/09/16 0412)  traMADol (ULTRAM) tablet 50 mg (not administered)  cefTRIAXone (ROCEPHIN) 1 g in dextrose 5 % 50 mL IVPB (0 g Intravenous Stopped 11/09/16 0731)  enoxaparin (LOVENOX) injection 40 mg (not administered)  sodium chloride 0.9 % bolus 500 mL (0 mLs Intravenous Stopped 11/09/16 0033)  piperacillin-tazobactam (ZOSYN) IVPB 3.375 g (0 g Intravenous Stopped 11/09/16 0001)  vancomycin (VANCOCIN) IVPB 1000 mg/200 mL premix (0 mg Intravenous Stopped 11/09/16 0052)  sodium chloride 0.9 % bolus 500 mL (0 mLs Intravenous Stopped 11/09/16 0033)     Initial Impression / Assessment and Plan / ED Course  I have reviewed the triage vital signs and the nursing notes.  Pertinent labs & imaging results that were available during my  care of the patient  were reviewed by me and considered in my medical decision making (see chart for details).     Initiated sepsis workup. Started on broad-spectrum antibiotics for UTI. Given IV fluids for dehydration. CT head without acute intracranial findings. Discussed with hospitalist who will admit. Final Clinical Impressions(s) / ED Diagnoses   Final diagnoses:  Lower urinary tract infectious disease  Sepsis, due to unspecified organism [facility])  AKI (acute kidney injury) [facility])  Dehydration    New Prescriptions Current Discharge Medication List       Julianne Rice, MD 11/09/16 1444

## 2016-11-09 ENCOUNTER — Encounter (HOSPITAL_COMMUNITY): Payer: Self-pay | Admitting: Internal Medicine

## 2016-11-09 DIAGNOSIS — E860 Dehydration: Secondary | ICD-10-CM | POA: Insufficient documentation

## 2016-11-09 DIAGNOSIS — N179 Acute kidney failure, unspecified: Secondary | ICD-10-CM | POA: Insufficient documentation

## 2016-11-09 DIAGNOSIS — R52 Pain, unspecified: Secondary | ICD-10-CM

## 2016-11-09 DIAGNOSIS — I10 Essential (primary) hypertension: Secondary | ICD-10-CM

## 2016-11-09 DIAGNOSIS — M810 Age-related osteoporosis without current pathological fracture: Secondary | ICD-10-CM

## 2016-11-09 DIAGNOSIS — F32A Depression, unspecified: Secondary | ICD-10-CM | POA: Diagnosis present

## 2016-11-09 DIAGNOSIS — F329 Major depressive disorder, single episode, unspecified: Secondary | ICD-10-CM

## 2016-11-09 DIAGNOSIS — A419 Sepsis, unspecified organism: Principal | ICD-10-CM

## 2016-11-09 DIAGNOSIS — N390 Urinary tract infection, site not specified: Secondary | ICD-10-CM | POA: Insufficient documentation

## 2016-11-09 LAB — COMPREHENSIVE METABOLIC PANEL
ALBUMIN: 3 g/dL — AB (ref 3.5–5.0)
ALK PHOS: 43 U/L (ref 38–126)
ALT: 33 U/L (ref 14–54)
AST: 47 U/L — AB (ref 15–41)
Anion gap: 8 (ref 5–15)
BUN: 36 mg/dL — ABNORMAL HIGH (ref 6–20)
CALCIUM: 8.4 mg/dL — AB (ref 8.9–10.3)
CO2: 24 mmol/L (ref 22–32)
CREATININE: 1.09 mg/dL — AB (ref 0.44–1.00)
Chloride: 112 mmol/L — ABNORMAL HIGH (ref 101–111)
GFR calc Af Amer: 52 mL/min — ABNORMAL LOW (ref >60)
GFR calc non Af Amer: 45 mL/min — ABNORMAL LOW (ref >60)
GLUCOSE: 150 mg/dL — AB (ref 65–99)
Potassium: 3.8 mmol/L (ref 3.5–5.1)
SODIUM: 144 mmol/L (ref 135–145)
Total Bilirubin: 0.9 mg/dL (ref 0.3–1.2)
Total Protein: 5.7 g/dL — ABNORMAL LOW (ref 6.5–8.1)

## 2016-11-09 LAB — TROPONIN I
TROPONIN I: 0.03 ng/mL — AB (ref <0.03)
Troponin I: 0.03 ng/mL (ref <0.03)
Troponin I: 0.03 ng/mL (ref <0.03)

## 2016-11-09 LAB — RAPID URINE DRUG SCREEN, HOSP PERFORMED
AMPHETAMINES: NOT DETECTED
BENZODIAZEPINES: NOT DETECTED
Barbiturates: NOT DETECTED
COCAINE: NOT DETECTED
Opiates: NOT DETECTED
Tetrahydrocannabinol: NOT DETECTED

## 2016-11-09 LAB — TSH: TSH: 1.114 u[IU]/mL (ref 0.350–4.500)

## 2016-11-09 LAB — CBC
HEMATOCRIT: 36.1 % (ref 36.0–46.0)
HEMOGLOBIN: 12 g/dL (ref 12.0–15.0)
MCH: 29.3 pg (ref 26.0–34.0)
MCHC: 33.2 g/dL (ref 30.0–36.0)
MCV: 88 fL (ref 78.0–100.0)
Platelets: 193 10*3/uL (ref 150–400)
RBC: 4.1 MIL/uL (ref 3.87–5.11)
RDW: 14.3 % (ref 11.5–15.5)
WBC: 11.1 10*3/uL — AB (ref 4.0–10.5)

## 2016-11-09 LAB — CK: CK TOTAL: 505 U/L — AB (ref 38–234)

## 2016-11-09 LAB — LACTIC ACID, PLASMA
LACTIC ACID, VENOUS: 1.6 mmol/L (ref 0.5–1.9)
Lactic Acid, Venous: 1.4 mmol/L (ref 0.5–1.9)

## 2016-11-09 MED ORDER — DEXTROSE 5 % IV SOLN
1.0000 g | INTRAVENOUS | Status: DC
  Administered 2016-11-09 – 2016-11-12 (×4): 1 g via INTRAVENOUS
  Filled 2016-11-09 (×4): qty 10

## 2016-11-09 MED ORDER — ENOXAPARIN SODIUM 30 MG/0.3ML ~~LOC~~ SOLN
30.0000 mg | SUBCUTANEOUS | Status: DC
  Administered 2016-11-09: 30 mg via SUBCUTANEOUS
  Filled 2016-11-09: qty 0.3

## 2016-11-09 MED ORDER — SODIUM CHLORIDE 0.9 % IV SOLN
INTRAVENOUS | Status: AC
  Administered 2016-11-09: 04:00:00 via INTRAVENOUS

## 2016-11-09 MED ORDER — TRAMADOL HCL 50 MG PO TABS
50.0000 mg | ORAL_TABLET | Freq: Two times a day (BID) | ORAL | Status: DC | PRN
  Administered 2016-11-10 – 2016-11-12 (×3): 50 mg via ORAL
  Filled 2016-11-09 (×3): qty 1

## 2016-11-09 MED ORDER — ENOXAPARIN SODIUM 40 MG/0.4ML ~~LOC~~ SOLN
40.0000 mg | SUBCUTANEOUS | Status: DC
  Administered 2016-11-10 – 2016-11-11 (×2): 40 mg via SUBCUTANEOUS
  Filled 2016-11-09 (×2): qty 0.4

## 2016-11-09 MED ORDER — SODIUM CHLORIDE 0.9% FLUSH
3.0000 mL | Freq: Two times a day (BID) | INTRAVENOUS | Status: DC
  Administered 2016-11-09 – 2016-11-11 (×6): 3 mL via INTRAVENOUS

## 2016-11-09 NOTE — ED Notes (Signed)
Attempted Report 

## 2016-11-09 NOTE — Progress Notes (Signed)
Pharmacy Antibiotic Note  KC SEDLAK is a 83 y.o. female admitted on 11/08/2016 with UTI.  Pharmacy has been consulted for Rocephin dosing.  Plan: Rocephin 1g IV Q24H.  Pharmacy will sign off.   Temp (24hrs), Avg:101.4 F (38.6 C), Min:101.4 F (38.6 C), Max:101.4 F (38.6 C)   Recent Labs Lab 11/08/16 2100 11/08/16 2118  WBC 10.9*  --   CREATININE 1.23*  --   LATICACIDVEN  --  1.76     Allergies  Allergen Reactions  . Codeine     unknown  . Diazepam     unknown  . Gabapentin     unknown  . Indomethacin     unknown     Thank you for allowing pharmacy to be a part of this patient's care.  Wynona Neat, PharmD, BCPS  11/09/2016 2:07 AM

## 2016-11-09 NOTE — Progress Notes (Signed)
PROGRESS NOTE    Alicia Roth  MWU:[id number] DOB: 06/29/1930 DOA: 11/08/2016 PCP: Alicia Mask, MD   Chief Complaint  Patient presents with  . Fall  . Altered Mental Status    Brief Narrative:  HPI On 11/08/2016 by Dr. Debe Roth Alicia Roth is a 83 y.o. female with medical history significant of osteoporosis, chronic arthralgia/pain, dementia, HTN, depression who presents after being found down.  Alicia Roth was last seen normal on Thursday by one of her sons.  She noted at that time that she had not felt well for the entire week.  She was seen also by a neighbor on Thursday.  She was not seen again for a few days.  Family became worried when they could not reach her and they made entry into her house today and found her down in the kitchen, moaning and confused.  She had multiple bruises and contusions and was complaining of pain.  She has presumed dementia and memory issues at baseline, she is on donepazil.  However, per daughter Alicia Roth, she does not take her medications regularly and will often forget them.  It is not clear what led up to her being found down, but she has now been found to have an abnormal UA and acute renal failure.  She has a large family with multiple children.  Daughters Alicia Roth and Alicia Roth were present in room and supplied some of the history.  The patient was awake to voice, but would not open eyes.  She answered some questions, but was confused.  Reported being in pain but could not tell me where.    Assessment & Plan   Sepsis secondary to UTI -On admission patient was febrile with tachycadia -UA: Many bacteria, TNTC WBC, large leukocytes, positive nitrites -Blood cultures pending -Urine culture not ordered, will order on previous sample if possible  -UDS negative -She was given Zosyn and vancomycin emergency department, however placed on ceftriaxone -Continue IV fluids  Falls -CT head/neck showed no acute fractures, small contusion on back of head -CK  505, will continue to trend and continue IVF -TSH 1.114 -PT and OT consulted and pending -?syncope  -continue telemetry -Troponins cycled and negative thus far  Acute kidney injury -likely secondary to sepsis vs medications -Creatinine upon admission 1.23, baseline Creatinine 0.7-0.8 -Currently creatinine 1.09 -Continue to monitr BMP  Back wound -likely from falling down -Continue wound care  Dementia  -Continue donepezil  Essential hypertension -lisinopril held in the setting of sepsis and acute kidney injury -BP currently stable  Depression -Continue paxil  Osteoporosis  -no fractures noted on xrays -Continue pain control as needed  Mildly elevated AST -Unknown source, Possibly secondary to sepsis -currently trending downward  DVT Prophylaxis  lovenox  Code Status: Full  Family Communication: None at bedside  Disposition Plan: Admitted. Continue to monitor. Pending PT/OT, urine cultures  Consultants None  Procedures  None  Antibiotics   Anti-infectives    Start     Dose/Rate Route Frequency Ordered Stop   11/09/16 0600  cefTRIAXone (ROCEPHIN) 1 g in dextrose 5 % 50 mL IVPB     1 g 100 mL/hr over 30 Minutes Intravenous Every 24 hours 11/09/16 0208     11/08/16 2215  piperacillin-tazobactam (ZOSYN) IVPB 3.375 g     3.375 g 100 mL/hr over 30 Minutes Intravenous  Once 11/08/16 2208 11/09/16 0001   11/08/16 2215  vancomycin (VANCOCIN) IVPB 1000 mg/200 mL premix     1,000 mg 200 mL/hr over  60 Minutes Intravenous  Once 11/08/16 2208 11/09/16 0052      Subjective:   Alicia Roth seen and examined today.  Patient with history of dementia. Denies any current pain or chest pain, denies any shortness of breath. Cannot recall why she came to the hospital.   Objective:   Vitals:   11/09/16 0200 11/09/16 0215 11/09/16 0219 11/09/16 0252  BP: (!) 111/58 (!) 114/58  115/61  Pulse: 84 79  91  Resp: 11 (!) 28  18  Temp:   98.9 F (37.2 C) 97.7 F (36.5 C)    TempSrc:   Rectal Oral  SpO2: 96% 90%  93%  Weight:    68.9 kg (151 lb 14.4 oz)  Height:    5\' 5"  (1.651 m)    Intake/Output Summary (Last 24 hours) at 11/09/16 1115 Last data filed at 11/09/16 0053  Gross per 24 hour  Intake             1700 ml  Output                0 ml  Net             1700 ml   Filed Weights   11/09/16 0252  Weight: 68.9 kg (151 lb 14.4 oz)    Exam  General: Well developed, well nourished, NAD, appears stated age  HEENT: NCAT, mucous membranes moist. Mild bruising around right eye  Cardiovascular: S1 S2 auscultated, RRR, no murmurs  Respiratory: Clear to auscultation bilaterally  Abdomen: Soft, nontender, nondistended, + bowel sounds  Extremities: warm dry without cyanosis clubbing or edema  Neuro: AAOx1 (self), nonfocal   Psych: appropriate, pleasant   Data Reviewed: I have personally reviewed following labs and imaging studies  CBC:  Recent Labs Lab 11/08/16 2100 11/09/16 0302  WBC 10.9* 11.1*  NEUTROABS 8.3*  --   HGB 13.2 12.0  HCT 39.0 36.1  MCV 87.2 88.0  PLT 203 193   Basic Metabolic Panel:  Recent Labs Lab 11/08/16 2100 11/09/16 0302  NA 139 144  K 3.6 3.8  CL 108 112*  CO2 19* 24  GLUCOSE 164* 150*  BUN 39* 36*  CREATININE 1.23* 1.09*  CALCIUM 8.7* 8.4*   GFR: Estimated Creatinine Clearance: 37.5 mL/min (A) (by C-G formula based on SCr of 1.09 mg/dL (H)). Liver Function Tests:  Recent Labs Lab 11/08/16 2100 11/09/16 0302  AST 53* 47*  ALT 37 33  ALKPHOS 49 43  BILITOT 1.2 0.9  PROT 6.3* 5.7*  ALBUMIN 3.3* 3.0*   No results for input(s): LIPASE, AMYLASE in the last 168 hours. No results for input(s): AMMONIA in the last 168 hours. Coagulation Profile: No results for input(s): INR, PROTIME in the last 168 hours. Cardiac Enzymes:  Recent Labs Lab 11/08/16 2100 11/09/16 0302  CKTOTAL 528* 505*  TROPONINI  --  0.03*   BNP (last 3 results) No results for input(s): PROBNP in the last 8760  hours. HbA1C: No results for input(s): HGBA1C in the last 72 hours. CBG: No results for input(s): GLUCAP in the last 168 hours. Lipid Profile: No results for input(s): CHOL, HDL, LDLCALC, TRIG, CHOLHDL, LDLDIRECT in the last 72 hours. Thyroid Function Tests:  Recent Labs  11/09/16 0302  TSH 1.114   Anemia Panel: No results for input(s): VITAMINB12, FOLATE, FERRITIN, TIBC, IRON, RETICCTPCT in the last 72 hours. Urine analysis:    Component Value Date/Time   COLORURINE YELLOW 11/08/2016 2101   APPEARANCEUR TURBID (A) 11/08/2016 2101  LABSPEC 1.015 11/08/2016 2101   PHURINE 6.0 11/08/2016 2101   GLUCOSEU NEGATIVE 11/08/2016 2101   HGBUR LARGE (A) 11/08/2016 2101   BILIRUBINUR NEGATIVE 11/08/2016 2101   KETONESUR 15 (A) 11/08/2016 2101   PROTEINUR NEGATIVE 11/08/2016 2101   UROBILINOGEN 0.2 05/30/2014 2308   NITRITE POSITIVE (A) 11/08/2016 2101   LEUKOCYTESUR LARGE (A) 11/08/2016 2101   Sepsis Labs: @LABRCNTIP (procalcitonin:4,lacticidven:4)  )No results found for this or any previous visit (from the past 240 hour(s)).    Radiology Studies: Dg Chest 1 View  Result Date: 11/08/2016 CLINICAL DATA:  Acute onset of fever.  Initial encounter. EXAM: CHEST 1 VIEW COMPARISON:  Chest radiograph performed 05/30/2014 FINDINGS: The lungs are well-aerated and clear. There is no evidence of focal opacification, pleural effusion or pneumothorax. The cardiomediastinal silhouette is borderline enlarged. No acute osseous abnormalities are seen. Thoracolumbar spinal fusion hardware is noted. The patient's right shoulder arthroplasty appears grossly unremarkable. Degenerative change is noted at the left glenohumeral joint. IMPRESSION: Borderline cardiomegaly.  Lungs remain grossly clear. Electronically Signed   By: Roanna Raider M.D.   On: 11/08/2016 21:42   Ct Head Wo Contrast  Result Date: 11/08/2016 CLINICAL DATA:  Patient found down on floor more so L2 clothing. Patient is disoriented.  EXAM: CT HEAD WITHOUT CONTRAST CT CERVICAL SPINE WITHOUT CONTRAST TECHNIQUE: Multidetector CT imaging of the head and cervical spine was performed following the standard protocol without intravenous contrast. Multiplanar CT image reconstructions of the cervical spine were also generated. COMPARISON:  08/20/2006 CT. FINDINGS: CT HEAD FINDINGS Brain: Sulcal and ventricular prominence in keeping with chronic atrophy. No acute intracranial hemorrhage, midline shift or edema. No large vascular territory infarction. No evidence hydrocephalus. Midline fourth ventricle and basal cisterns. Vascular: No hyperdense vessel or unexpected calcification. Moderate atherosclerosis of the carotid siphons. Skull: No skull fracture Sinuses/Orbits: Intact orbits and globes.  No acute sinus disease. Other: Scalp contusion overlying the occiput. CT CERVICAL SPINE FINDINGS Alignment: Minimal grade 1 anterolisthesis of T1 on T2, likely on the basis of degenerative facet arthropathy. No posttraumatic malalignment. Skull base and vertebrae: Negative for skull or cervical spine fracture. Soft tissues and spinal canal: No prevertebral fluid or swelling. No visible canal hematoma. Disc levels: Diffuse disc space narrowing and facet arthropathy most prominent from C3 through C7 with small posterior disc - osteophyte complexes. No jumped or perched facets. Upper chest: Negative. Other: None IMPRESSION: 1. Chronic stable cerebral atrophy without acute intracranial abnormality or skull fracture. 2. Small occipital scalp contusion. 3. Cervical spondylosis without acute posttraumatic fracture or subluxations. Electronically Signed   By: Tollie Eth M.D.   On: 11/08/2016 22:57   Ct Cervical Spine Wo Contrast  Result Date: 11/08/2016 CLINICAL DATA:  Patient found down on floor more so L2 clothing. Patient is disoriented. EXAM: CT HEAD WITHOUT CONTRAST CT CERVICAL SPINE WITHOUT CONTRAST TECHNIQUE: Multidetector CT imaging of the head and cervical  spine was performed following the standard protocol without intravenous contrast. Multiplanar CT image reconstructions of the cervical spine were also generated. COMPARISON:  08/20/2006 CT. FINDINGS: CT HEAD FINDINGS Brain: Sulcal and ventricular prominence in keeping with chronic atrophy. No acute intracranial hemorrhage, midline shift or edema. No large vascular territory infarction. No evidence hydrocephalus. Midline fourth ventricle and basal cisterns. Vascular: No hyperdense vessel or unexpected calcification. Moderate atherosclerosis of the carotid siphons. Skull: No skull fracture Sinuses/Orbits: Intact orbits and globes.  No acute sinus disease. Other: Scalp contusion overlying the occiput. CT CERVICAL SPINE FINDINGS Alignment: Minimal grade 1 anterolisthesis  of T1 on T2, likely on the basis of degenerative facet arthropathy. No posttraumatic malalignment. Skull base and vertebrae: Negative for skull or cervical spine fracture. Soft tissues and spinal canal: No prevertebral fluid or swelling. No visible canal hematoma. Disc levels: Diffuse disc space narrowing and facet arthropathy most prominent from C3 through C7 with small posterior disc - osteophyte complexes. No jumped or perched facets. Upper chest: Negative. Other: None IMPRESSION: 1. Chronic stable cerebral atrophy without acute intracranial abnormality or skull fracture. 2. Small occipital scalp contusion. 3. Cervical spondylosis without acute posttraumatic fracture or subluxations. Electronically Signed   By: Tollie Eth M.D.   On: 11/08/2016 22:57   Dg Hip Unilat W Or Wo Pelvis 2-3 Views Left  Result Date: 11/08/2016 CLINICAL DATA:  Found down, lying on back on kitchen floor. Initial encounter. EXAM: DG HIP (WITH OR WITHOUT PELVIS) 2-3V LEFT COMPARISON:  None. FINDINGS: There is no evidence of fracture or dislocation. Both femoral heads are seated normally within their respective acetabula. The proximal left femur appears intact. No  significant degenerative change is appreciated. The sacroiliac joints are unremarkable in appearance. Visualized lumbar spinal fusion hardware is grossly unremarkable. The visualized bowel gas pattern is grossly unremarkable in appearance. IMPRESSION: No evidence of fracture or dislocation. Electronically Signed   By: Roanna Raider M.D.   On: 11/08/2016 21:43     Scheduled Meds: . aspirin  325 mg Oral Daily  . donepezil  10 mg Oral QHS  . enoxaparin (LOVENOX) injection  30 mg Subcutaneous Q24H  . guaiFENesin  400 mg Oral Q4H  . PARoxetine  10 mg Oral Daily  . sodium chloride flush  3 mL Intravenous Q12H   Continuous Infusions: . sodium chloride 75 mL/hr at 11/09/16 0412  . cefTRIAXone (ROCEPHIN)  IV Stopped (11/09/16 0731)     LOS: 1 day   Time Spent in minutes   30 minutes  Tiffay Pinette D.O. on 11/09/2016 at 11:15 AM  Between 7am to 7pm - Pager - [phone]  After 7pm go to [email or web address] - password [email or web address]  And look for the night coverage person covering for me after hours  [facility] Office  [phone]

## 2016-11-09 NOTE — Consult Note (Signed)
[facility] Nurse wound consult note Reason for Consult: Patient found down, had been down in kitchen for 24-48 hours.  Deep tissue pressure injury to sacral and coccygeal areas, abrasion to right hip. Patient had been incontinent of both urine and stool. Edematous area on the thoracic spine, family notes that she has rods in her back and is asking for further assessment of the edematous area there. If you agree, please address. Wound type: Pressure, moisture. Pressure Injury POA: Yes Measurement: Thoracic spine:  Area of puffy edema without bruising measuring 3cm x 8cm Sacral/coccygeal:  10cm x 14cm area of involvement with white discolored center measuring 8cm x 10cm. Partial thickness skin loss surrounds the white discoloration which is likely to become eschar. Embedded in the affected area at 5 o'clock, there is a 1cm x 2cm area of purple discoloration noted. Right hip:  10cm x 12cm x 0.1cm area of abraded tissue. Red, dry. Perineal area with resolving moisture associated skin damage, specifically incontinence associated dermatitis. Wound bed: AS described above Drainage (amount, consistency, odor) As described above Periwound: intact, dry. Dressing procedure/placement/frequency: I will today provide a mattress replacement with low air loss feature, bilateral pressure redistribution heel boots and guidance for Nursing for topical care of the three areas noted to be affected: the thoracic spine, the sacral/coccygeal area and the right hip. We will cover the affected areas with silicone foam dressings and Nursing is to peel back to observe/assess twice daily.  Patient is to be positioned off of the supine position except for meals. A powered external female urinary containment device (PurWick) is in place to manage UI.  WOC nursing team will not follow routinely, but will remain available to this patient, the nursing and medical teams.  Please re-consult if needed. Thanks, Maudie Flakes, MSN, RN, [place],  Arther Abbott  Pager# [phone]

## 2016-11-09 NOTE — Progress Notes (Signed)
MD paged for diet order.  Will continue to monitor.

## 2016-11-10 ENCOUNTER — Inpatient Hospital Stay (HOSPITAL_COMMUNITY): Payer: Medicare Other

## 2016-11-10 DIAGNOSIS — M546 Pain in thoracic spine: Secondary | ICD-10-CM

## 2016-11-10 DIAGNOSIS — E876 Hypokalemia: Secondary | ICD-10-CM

## 2016-11-10 LAB — BASIC METABOLIC PANEL
Anion gap: 7 (ref 5–15)
BUN: 27 mg/dL — ABNORMAL HIGH (ref 6–20)
CO2: 23 mmol/L (ref 22–32)
Calcium: 8.2 mg/dL — ABNORMAL LOW (ref 8.9–10.3)
Chloride: 110 mmol/L (ref 101–111)
Creatinine, Ser: 0.82 mg/dL (ref 0.44–1.00)
GFR calc Af Amer: >60 mL/min (ref >60)
GFR calc non Af Amer: >60 mL/min (ref >60)
Glucose, Bld: 96 mg/dL (ref 65–99)
Potassium: 3.4 mmol/L — ABNORMAL LOW (ref 3.5–5.1)
Sodium: 140 mmol/L (ref 135–145)

## 2016-11-10 LAB — CBC
HEMATOCRIT: 35.4 % — AB (ref 36.0–46.0)
HEMOGLOBIN: 11.6 g/dL — AB (ref 12.0–15.0)
MCH: 28.7 pg (ref 26.0–34.0)
MCHC: 32.8 g/dL (ref 30.0–36.0)
MCV: 87.6 fL (ref 78.0–100.0)
Platelets: 174 10*3/uL (ref 150–400)
RBC: 4.04 MIL/uL (ref 3.87–5.11)
RDW: 14.4 % (ref 11.5–15.5)
WBC: 5.8 10*3/uL (ref 4.0–10.5)

## 2016-11-10 MED ORDER — POTASSIUM CHLORIDE CRYS ER 20 MEQ PO TBCR
40.0000 meq | EXTENDED_RELEASE_TABLET | Freq: Once | ORAL | Status: AC
  Administered 2016-11-10: 40 meq via ORAL
  Filled 2016-11-10: qty 2

## 2016-11-10 NOTE — Progress Notes (Addendum)
PROGRESS NOTE    Alicia Roth  WUJ:[id number] DOB: 1930-07-18 DOA: 11/08/2016 PCP: Kaleen Mask, MD   Chief Complaint  Patient presents with  . Fall  . Altered Mental Status    Brief Narrative:  HPI On 11/08/2016 by Dr. Debe Coder Alicia Roth is a 83 y.o. female with medical history significant of osteoporosis, chronic arthralgia/pain, dementia, HTN, depression who presents after being found down.  [name] was last seen normal on Thursday by one of her sons.  She noted at that time that she had not felt well for the entire week.  She was seen also by a neighbor on Thursday.  She was not seen again for a few days.  Family became worried when they could not reach her and they made entry into her house today and found her down in the kitchen, moaning and confused.  She had multiple bruises and contusions and was complaining of pain.  She has presumed dementia and memory issues at baseline, she is on donepazil.  However, per daughter [name], she does not take her medications regularly and will often forget them.  It is not clear what led up to her being found down, but she has now been found to have an abnormal UA and acute renal failure.  She has a large family with multiple children.  Daughters [name] and [name] were present in room and supplied some of the history.  The patient was awake to voice, but would not open eyes.  She answered some questions, but was confused.  Reported being in pain but could not tell me where.    Interim history Admitted for sepsis secondary UTI. PT and OT consulted for falls.  Assessment & Plan   Sepsis secondary to UTI -On admission patient was febrile with tachycardia, vitals sounds improving  -UA: Many bacteria, TNTC WBC, large leukocytes, positive nitrites -Blood cultures show no growth to date -Urine culture >100K GNR (not ordered on admission, spoke with lab to add onto previous urine collected) -UDS negative -She was given Zosyn and vancomycin  emergency department, however placed on ceftriaxone  Falls -CT head/neck showed no acute fractures, small contusion on back of head -CK 505, will continue to trend and continue IVF -TSH 1.114 -PT recommended SNF, will consult social work -?syncope  -continue telemetry -Troponins cycled and negative thus far  Acute metabolic encephalopathy -Likely secondary to UTI -Discussed with family at bedside, patient was normally very functional and living alone, however a couple of days prior to admission, patient's mental status began to worsen. -Per family, mental status has mildly improved however she is not back to her baseline  Acute kidney injury -likely secondary to sepsis vs medications -Creatinine upon admission 1.23, baseline Creatinine 0.7-0.8 -Currently creatinine 0.82 -Continue to monitr BMP  Back wound -likely from falling down -Family concerned for infection.  -Currently erythema noted on lumbar region, with minimal serosanguinous drainage -has small edematous area on thoracic spine, obtained thoracic xray: remote T8 and T12 compression fractures and right rib fracture, no acute abnormality  -Continue air mattress -Continue wound care  Dementia  -Continue donepezil  Essential hypertension -lisinopril held in the setting of sepsis and acute kidney injury -BP stable, currently 132/60  Depression -Continue paxil  Osteoporosis  -no fractures noted on xrays -Continue pain control as needed  Mildly elevated AST -Unknown source, Possibly secondary to sepsis -currently trending downward  Hypokalemia -Will order replacement, continue to monitor BMP  Goals of care -Given patient's dementia, urged  patient's family to discuss goals of care and CODE STATUS.  DVT Prophylaxis  lovenox  Code Status: Full  Family Communication: Family at bedside.  Disposition Plan: Admitted. Continue treatment of sepsis. Pending OT consult. Will consult social work for SNF  placement  Consultants None  Procedures  None  Antibiotics   Anti-infectives    Start     Dose/Rate Route Frequency Ordered Stop   11/09/16 0600  cefTRIAXone (ROCEPHIN) 1 g in dextrose 5 % 50 mL IVPB     1 g 100 mL/hr over 30 Minutes Intravenous Every 24 hours 11/09/16 0208     11/08/16 2215  piperacillin-tazobactam (ZOSYN) IVPB 3.375 g     3.375 g 100 mL/hr over 30 Minutes Intravenous  Once 11/08/16 2208 11/09/16 0001   11/08/16 2215  vancomycin (VANCOCIN) IVPB 1000 mg/200 mL premix     1,000 mg 200 mL/hr over 60 Minutes Intravenous  Once 11/08/16 2208 11/09/16 0052      Subjective:   Alicia Roth seen and examined today.  Patient with history of dementia. States she would like to go home and die. Currently complains of back pain. Denies chest pain, shortness breath, abdominal pain, nausea or vomiting, diarrhea or constipation.  Objective:   Vitals:   11/09/16 1933 11/09/16 1948 11/10/16 0100 11/10/16 0406  BP:  (!) 135/51 119/67 132/60  Pulse:  88 92 78  Resp:  18 18 20   Temp: 99 F (37.2 C) 98.5 F (36.9 C) 99.3 F (37.4 C) 98.4 F (36.9 C)  TempSrc: Oral Oral Oral Oral  SpO2:  98% 99% 97%  Weight:      Height:        Intake/Output Summary (Last 24 hours) at 11/10/16 1206 Last data filed at 11/10/16 0100  Gross per 24 hour  Intake              120 ml  Output              250 ml  Net             -130 ml   Filed Weights   11/09/16 0252  Weight: 68.9 kg (151 lb 14.4 oz)   Exam  General: Well developed, elderly, NAD  HEENT: NCAT, mucous membranes moist. Mild bruising around right eye  Cardiovascular: S1 S2 auscultated, RRR, no murmurs  Respiratory: Clear to auscultation bilaterally  Abdomen: Soft, nontender, nondistended, + bowel sounds  Extremities: warm dry without cyanosis clubbing or edema  Neuro: AAOx1 (self), Nonfocal  Skin: lumbar spine wound with erythema and serosanguineous drainage  Psych: pleasant  Data Reviewed: I have personally  reviewed following labs and imaging studies  CBC:  Recent Labs Lab 11/08/16 2100 11/09/16 0302 11/10/16 0410  WBC 10.9* 11.1* 5.8  NEUTROABS 8.3*  --   --   HGB 13.2 12.0 11.6*  HCT 39.0 36.1 35.4*  MCV 87.2 88.0 87.6  PLT 203 193 174   Basic Metabolic Panel:  Recent Labs Lab 11/08/16 2100 11/09/16 0302 11/10/16 0410  NA 139 144 140  K 3.6 3.8 3.4*  CL 108 112* 110  CO2 19* 24 23  GLUCOSE 164* 150* 96  BUN 39* 36* 27*  CREATININE 1.23* 1.09* 0.82  CALCIUM 8.7* 8.4* 8.2*   GFR: Estimated Creatinine Clearance: 49.8 mL/min (by C-G formula based on SCr of 0.82 mg/dL). Liver Function Tests:  Recent Labs Lab 11/08/16 2100 11/09/16 0302  AST 53* 47*  ALT 37 33  ALKPHOS 49 43  BILITOT 1.2 0.9  PROT 6.3* 5.7*  ALBUMIN 3.3* 3.0*   No results for input(s): LIPASE, AMYLASE in the last 168 hours. No results for input(s): AMMONIA in the last 168 hours. Coagulation Profile: No results for input(s): INR, PROTIME in the last 168 hours. Cardiac Enzymes:  Recent Labs Lab 11/08/16 2100 11/09/16 0302 11/09/16 0953 11/09/16 1617  CKTOTAL 528* 505*  --   --   TROPONINI  --  0.03* 0.03* 0.03*   BNP (last 3 results) No results for input(s): PROBNP in the last 8760 hours. HbA1C: No results for input(s): HGBA1C in the last 72 hours. CBG: No results for input(s): GLUCAP in the last 168 hours. Lipid Profile: No results for input(s): CHOL, HDL, LDLCALC, TRIG, CHOLHDL, LDLDIRECT in the last 72 hours. Thyroid Function Tests:  Recent Labs  11/09/16 0302  TSH 1.114   Anemia Panel: No results for input(s): VITAMINB12, FOLATE, FERRITIN, TIBC, IRON, RETICCTPCT in the last 72 hours. Urine analysis:    Component Value Date/Time   COLORURINE YELLOW 11/08/2016 2101   APPEARANCEUR TURBID (A) 11/08/2016 2101   LABSPEC 1.015 11/08/2016 2101   PHURINE 6.0 11/08/2016 2101   GLUCOSEU NEGATIVE 11/08/2016 2101   HGBUR LARGE (A) 11/08/2016 2101   BILIRUBINUR NEGATIVE 11/08/2016  2101   KETONESUR 15 (A) 11/08/2016 2101   PROTEINUR NEGATIVE 11/08/2016 2101   UROBILINOGEN 0.2 05/30/2014 2308   NITRITE POSITIVE (A) 11/08/2016 2101   LEUKOCYTESUR LARGE (A) 11/08/2016 2101   Sepsis Labs: @LABRCNTIP (procalcitonin:4,lacticidven:4)  ) Recent Results (from the past 240 hour(s))  Blood Culture (routine x 2)     Status: None (Preliminary result)   Collection Time: 11/08/16  9:00 PM  Result Value Ref Range Status   Specimen Description BLOOD LEFT ANTECUBITAL  Final   Special Requests   Final    BOTTLES DRAWN AEROBIC AND ANAEROBIC Blood Culture adequate volume   Culture NO GROWTH < 12 HOURS  Final   Report Status PENDING  Incomplete  Urine Culture     Status: Abnormal (Preliminary result)   Collection Time: 11/08/16  9:01 PM  Result Value Ref Range Status   Specimen Description URINE, CATHETERIZED  Final   Special Requests NONE  Final   Culture >=100,000 COLONIES/mL GRAM NEGATIVE RODS (A)  Final   Report Status PENDING  Incomplete  Blood Culture (routine x 2)     Status: None (Preliminary result)   Collection Time: 11/08/16 10:24 PM  Result Value Ref Range Status   Specimen Description BLOOD RIGHT HAND  Final   Special Requests   Final    BOTTLES DRAWN AEROBIC AND ANAEROBIC Blood Culture adequate volume   Culture NO GROWTH < 12 HOURS  Final   Report Status PENDING  Incomplete      Radiology Studies: Dg Chest 1 View  Result Date: 11/08/2016 CLINICAL DATA:  Acute onset of fever.  Initial encounter. EXAM: CHEST 1 VIEW COMPARISON:  Chest radiograph performed 05/30/2014 FINDINGS: The lungs are well-aerated and clear. There is no evidence of focal opacification, pleural effusion or pneumothorax. The cardiomediastinal silhouette is borderline enlarged. No acute osseous abnormalities are seen. Thoracolumbar spinal fusion hardware is noted. The patient's right shoulder arthroplasty appears grossly unremarkable. Degenerative change is noted at the left glenohumeral joint.  IMPRESSION: Borderline cardiomegaly.  Lungs remain grossly clear. Electronically Signed   By: Roanna Raider M.D.   On: 11/08/2016 21:42   Dg Thoracic Spine 2 View  Result Date: 11/10/2016 CLINICAL DATA:  Chronic thoracic spine pain. EXAM: THORACIC SPINE 2 VIEWS COMPARISON:  CT thoracic spine 08/19/2016. FINDINGS: Spinal fusion hardware extending from T9 into the lumbar spine is incompletely imaged. Mild compression fracture of T8 and severe compression fracture of T12 are unchanged. There is no new fracture. Alignment is maintained. Intervertebral disc space height is maintained. Remote right rib fractures are incidentally noted. IMPRESSION: No acute abnormality. Remote T8 and T12 compression fractures. Partial visualization of thoracolumbar fusion hardware. Remote right rib fractures. Electronically Signed   By: Drusilla Kanner M.D.   On: 11/10/2016 09:46   Ct Head Wo Contrast  Result Date: 11/08/2016 CLINICAL DATA:  Patient found down on floor more so L2 clothing. Patient is disoriented. EXAM: CT HEAD WITHOUT CONTRAST CT CERVICAL SPINE WITHOUT CONTRAST TECHNIQUE: Multidetector CT imaging of the head and cervical spine was performed following the standard protocol without intravenous contrast. Multiplanar CT image reconstructions of the cervical spine were also generated. COMPARISON:  08/20/2006 CT. FINDINGS: CT HEAD FINDINGS Brain: Sulcal and ventricular prominence in keeping with chronic atrophy. No acute intracranial hemorrhage, midline shift or edema. No large vascular territory infarction. No evidence hydrocephalus. Midline fourth ventricle and basal cisterns. Vascular: No hyperdense vessel or unexpected calcification. Moderate atherosclerosis of the carotid siphons. Skull: No skull fracture Sinuses/Orbits: Intact orbits and globes.  No acute sinus disease. Other: Scalp contusion overlying the occiput. CT CERVICAL SPINE FINDINGS Alignment: Minimal grade 1 anterolisthesis of T1 on T2, likely on the  basis of degenerative facet arthropathy. No posttraumatic malalignment. Skull base and vertebrae: Negative for skull or cervical spine fracture. Soft tissues and spinal canal: No prevertebral fluid or swelling. No visible canal hematoma. Disc levels: Diffuse disc space narrowing and facet arthropathy most prominent from C3 through C7 with small posterior disc - osteophyte complexes. No jumped or perched facets. Upper chest: Negative. Other: None IMPRESSION: 1. Chronic stable cerebral atrophy without acute intracranial abnormality or skull fracture. 2. Small occipital scalp contusion. 3. Cervical spondylosis without acute posttraumatic fracture or subluxations. Electronically Signed   By: Tollie Eth M.D.   On: 11/08/2016 22:57   Ct Cervical Spine Wo Contrast  Result Date: 11/08/2016 CLINICAL DATA:  Patient found down on floor more so L2 clothing. Patient is disoriented. EXAM: CT HEAD WITHOUT CONTRAST CT CERVICAL SPINE WITHOUT CONTRAST TECHNIQUE: Multidetector CT imaging of the head and cervical spine was performed following the standard protocol without intravenous contrast. Multiplanar CT image reconstructions of the cervical spine were also generated. COMPARISON:  08/20/2006 CT. FINDINGS: CT HEAD FINDINGS Brain: Sulcal and ventricular prominence in keeping with chronic atrophy. No acute intracranial hemorrhage, midline shift or edema. No large vascular territory infarction. No evidence hydrocephalus. Midline fourth ventricle and basal cisterns. Vascular: No hyperdense vessel or unexpected calcification. Moderate atherosclerosis of the carotid siphons. Skull: No skull fracture Sinuses/Orbits: Intact orbits and globes.  No acute sinus disease. Other: Scalp contusion overlying the occiput. CT CERVICAL SPINE FINDINGS Alignment: Minimal grade 1 anterolisthesis of T1 on T2, likely on the basis of degenerative facet arthropathy. No posttraumatic malalignment. Skull base and vertebrae: Negative for skull or cervical  spine fracture. Soft tissues and spinal canal: No prevertebral fluid or swelling. No visible canal hematoma. Disc levels: Diffuse disc space narrowing and facet arthropathy most prominent from C3 through C7 with small posterior disc - osteophyte complexes. No jumped or perched facets. Upper chest: Negative. Other: None IMPRESSION: 1. Chronic stable cerebral atrophy without acute intracranial abnormality or skull fracture. 2. Small occipital scalp contusion. 3. Cervical spondylosis without acute posttraumatic fracture or subluxations. Electronically Signed   By:  Tollie Eth M.D.   On: 11/08/2016 22:57   Dg Hip Unilat W Or Wo Pelvis 2-3 Views Left  Result Date: 11/08/2016 CLINICAL DATA:  Found down, lying on back on kitchen floor. Initial encounter. EXAM: DG HIP (WITH OR WITHOUT PELVIS) 2-3V LEFT COMPARISON:  None. FINDINGS: There is no evidence of fracture or dislocation. Both femoral heads are seated normally within their respective acetabula. The proximal left femur appears intact. No significant degenerative change is appreciated. The sacroiliac joints are unremarkable in appearance. Visualized lumbar spinal fusion hardware is grossly unremarkable. The visualized bowel gas pattern is grossly unremarkable in appearance. IMPRESSION: No evidence of fracture or dislocation. Electronically Signed   By: Roanna Raider M.D.   On: 11/08/2016 21:43     Scheduled Meds: . aspirin  325 mg Oral Daily  . donepezil  10 mg Oral QHS  . enoxaparin (LOVENOX) injection  40 mg Subcutaneous Q24H  . guaiFENesin  400 mg Oral Q4H  . PARoxetine  10 mg Oral Daily  . sodium chloride flush  3 mL Intravenous Q12H   Continuous Infusions: . cefTRIAXone (ROCEPHIN)  IV Stopped (11/10/16 0530)     LOS: 2 days   Time Spent in minutes   30 minutes  Raksha Wolfgang D.O. on 11/10/2016 at 12:06 PM  Between 7am to 7pm - Pager - [phone]  After 7pm go to [email or web address] - password [email or web address]  And look for the night coverage  person covering for me after hours  [facility] Office  [phone]

## 2016-11-10 NOTE — Evaluation (Signed)
Physical Therapy Evaluation Patient Details Name: ZINACHIMDI RILING MRN: [id number] DOB: 09-17-1930 Today's Date: 11/10/2016   History of Present Illness  YUMALAI GRITZ a 83 y.o.femalewith medical history significant of osteoporosis, chronic arthralgia/pain, dementia, HTN, depression who presents after being found down. Noted skin breakdown on sacrum and R hip for being down 24-48hours and being incontinent of urine and stool.  Clinical Impression  Pt was living at home alone but now presents with confusion, R hip pain, impaired balance and requires modA for mobility and ADLs. Pt to benefit from ST-SNF upon d/c to achieve safe mod I level of function for safe transition home.    Follow Up Recommendations SNF;Supervision/Assistance - 24 hour    Equipment Recommendations  Rolling walker with 5" wheels    Recommendations for Other Services       Precautions / Restrictions Precautions Precautions: Fall Precaution Comments: confusion Restrictions Weight Bearing Restrictions: No      Mobility  Bed Mobility Overal bed mobility: Needs Assistance Bed Mobility: Rolling;Sidelying to Sit Rolling: Mod assist;Max assist Sidelying to sit: Mod assist;Max assist       General bed mobility comments: max directional v/c's, pt initiates with tactile cues, modA to complete rolling, mod/maxA for trunk elevation to sit at EOB  Transfers Overall transfer level: Needs assistance Equipment used: 1 person hand held assist Transfers: Sit to/from [employer] Sit to Stand: Mod assist Stand pivot transfers: Min assist       General transfer comment: max directional verbal and tactile cues. pt required modA to intiate power up, minA for walker management during stand pvt to/from [facility]  Ambulation/Gait Ambulation/Gait assistance: Min assist Ambulation Distance (Feet):  (completed 2 std pvt transfers) Assistive device: Rolling walker (2 wheeled) Gait Pattern/deviations:  Step-through pattern;Decreased stride length Gait velocity: slow Gait velocity interpretation: Below normal speed for age/gender General Gait Details: pt with noted delayed processing but able to clear feet  Stairs            Wheelchair Mobility    Modified Rankin (Stroke Patients Only)       Balance Overall balance assessment: Needs assistance Sitting-balance support: Feet supported;No upper extremity supported Sitting balance-Leahy Scale: Fair     Standing balance support: Bilateral upper extremity supported Standing balance-Leahy Scale: Poor Standing balance comment: requires external support or RW                             Pertinent Vitals/Pain Pain Assessment: Faces Faces Pain Scale: Hurts even more Pain Location: back with movement, R hip at abrasion area Pain Intervention(s): Monitored during session    Home Living Family/patient expects to be discharged to:: Skilled nursing facility Living Arrangements: Alone               Additional Comments: pt was living alone    Prior Function Level of Independence: Independent         Comments: granddaughter reports pt to be living alone. pt poor historian due to confusion     Hand Dominance   Dominant Hand: Right    Extremity/Trunk Assessment   Upper Extremity Assessment Upper Extremity Assessment: Generalized weakness    Lower Extremity Assessment Lower Extremity Assessment: Generalized weakness (R hip with noted abrasion)    Cervical / Trunk Assessment Cervical / Trunk Assessment: Kyphotic  Communication   Communication: Receptive difficulties  Cognition Arousal/Alertness: Awake/alert Behavior During Therapy: Flat affect Overall Cognitive Status: Impaired/Different from baseline Area of Impairment:  Orientation;Attention;Memory;Following commands;Safety/judgement;Awareness;Problem solving                 Orientation Level: Disoriented to;Place;Time;Situation Current  Attention Level: Sustained Memory: Decreased short-term memory   Safety/Judgement: Decreased awareness of safety;Decreased awareness of deficits Awareness: Emergent ("i have to go to the bathroom) Problem Solving: Slow processing;Difficulty sequencing;Decreased initiation;Requires verbal cues;Requires tactile cues General Comments: pt pleasantly confused. discussing past but unable to orient to present. pt did state she lives by herself but poor historian of PLOF      General Comments General comments (skin integrity, edema, etc.): pt with skin breakdown on sacrum, R hip abrasion    Exercises     Assessment/Plan    PT Assessment Patient needs continued PT services  PT Problem List Decreased strength;Decreased activity tolerance;Decreased balance;Decreased mobility;Decreased coordination;Decreased cognition;Decreased knowledge of use of DME;Decreased safety awareness       PT Treatment Interventions DME instruction;Gait training;Stair training;Functional mobility training;Therapeutic activities;Therapeutic exercise;Balance training    PT Goals (Current goals can be found in the Care Plan section)  Acute Rehab PT Goals Patient Stated Goal: didn't state PT Goal Formulation: Patient unable to participate in goal setting Time For Goal Achievement: 11/17/16 Potential to Achieve Goals: Good    Frequency Min 3X/week   Barriers to discharge Decreased caregiver support lives alone    Co-evaluation               AM-PAC PT "6 Clicks" Daily Activity  Outcome Measure Difficulty turning over in bed (including adjusting bedclothes, sheets and blankets)?: Total Difficulty moving from lying on back to sitting on the side of the bed? : Total Difficulty sitting down on and standing up from a chair with arms (e.g., wheelchair, bedside commode, etc,.)?: Total Help needed moving to and from a bed to chair (including a wheelchair)?: A Lot Help needed walking in hospital room?: A Lot Help  needed climbing 3-5 steps with a railing? : Total 6 Click Score: 8    End of Session Equipment Utilized During Treatment: Gait belt Activity Tolerance: Patient tolerated treatment well Patient left: in chair;with call bell/phone within reach;with chair alarm set Nurse Communication: Mobility status PT Visit Diagnosis: Muscle weakness (generalized) (M62.81);Difficulty in walking, not elsewhere classified (R26.2)    Time: 1610-9604 PT Time Calculation (min) (ACUTE ONLY): 36 min   Charges:   PT Evaluation $PT Eval Moderate Complexity: 1 Mod PT Treatments $Therapeutic Activity: 8-22 mins   PT G Codes:        Lewis Shock, PT, DPT Pager #: [phone] Office #: [phone]   Jeromy Borcherding M Ho Parisi 11/10/2016, 9:39 AM

## 2016-11-10 NOTE — Evaluation (Signed)
Occupational Therapy Evaluation Patient Details Name: Alicia Roth MRN: [id number] DOB: 1931-01-24 Today's Date: 11/10/2016    History of Present Illness Alicia Roth a 83 y.o.femalewith medical history significant of osteoporosis, chronic arthralgia/pain, dementia, HTN, depression who presents after being found down. Noted skin breakdown on sacrum and R hip for being down 24-48hours and being incontinent of urine and stool.   Clinical Impression   Pt with decline in function and safety with ADLs and ADL mobility with decreased strength, balance and endurance with hx of cognitive impairments. Pt is pleasantly confused and required multimodal cues to attend to tasks and extensive assist with ADLs/ Pt would benefit from acute OT services to address impairments to increase level of function and safety    Follow Up Recommendations  SNF;Supervision/Assistance - 24 hour    Equipment Recommendations  Other (comment) (TBD at next venue of care)    Recommendations for Other Services       Precautions / Restrictions Precautions Precautions: Fall Precaution Comments: confusion Restrictions Weight Bearing Restrictions: No      Mobility Bed Mobility Overal bed mobility: Needs Assistance Bed Mobility: Rolling;Sidelying to Sit;Sit to Sidelying Rolling: Mod assist;Max assist Sidelying to sit: Mod assist;Max assist     Sit to sidelying: Max assist General bed mobility comments: max directional v/c's, pt initiates with tactile cues, modA to complete rolling, mod/maxA for trunk elevation to sit at EOB  Transfers                 General transfer comment: N/Tdue to pt reporting increased pain in R hip when attempting to stand and decline further attempts. Per PT note, pt requires mod - min A    Balance Overall balance assessment: Needs assistance Sitting-balance support: Feet supported;No upper extremity supported Sitting balance-Leahy Scale: Fair                                      ADL either performed or assessed with clinical judgement   ADL Overall ADL's : Needs assistance/impaired     Grooming: Wash/dry hands;Wash/dry face;Sitting;Min guard Grooming Details (indicate cue type and reason): decreasesd sitting balance Upper Body Bathing: Minimal assistance;Sitting (simulated)   Lower Body Bathing: Maximal assistance;Sitting/lateral leans (simulated)   Upper Body Dressing : Minimal assistance;Sitting   Lower Body Dressing: Total assistance     Toilet Transfer Details (indicate cue type and reason): N/Tdue to pt reporting increased pain in R hip when attempting to stand and decline further attempts. Per PT note, pt requires mod - min A         Functional mobility during ADLs:  (pt declined due to pain) General ADL Comments: pt pleasantly confused and requires multimodal cues to initiate and attend to tasks     Vision Baseline Vision/History: Wears glasses Wears Glasses: At all times Patient Visual Report: No change from baseline                  Pertinent Vitals/Pain Faces Pain Scale: Hurts even more Pain Location: back, R hip an B shoulders with movement Pain Intervention(s): Limited activity within patient's tolerance;Monitored during session;Repositioned     Hand Dominance Right   Extremity/Trunk Assessment Upper Extremity Assessment Upper Extremity Assessment: Generalized weakness   Lower Extremity Assessment Lower Extremity Assessment: Defer to PT evaluation   Cervical / Trunk Assessment Cervical / Trunk Assessment: Kyphotic   Communication Communication Communication: Receptive difficulties   Cognition  Arousal/Alertness: Awake/alert Behavior During Therapy: WFL for tasks assessed/performed Overall Cognitive Status: Impaired/Different from baseline Area of Impairment: Orientation;Attention;Memory;Following commands;Safety/judgement;Awareness;Problem solving                 Orientation Level:  Disoriented to;Place;Time;Situation Current Attention Level: Divided Memory: Decreased short-term memory Following Commands: Follows multi-step commands inconsistently Safety/Judgement: Decreased awareness of safety;Decreased awareness of deficits   Problem Solving: Slow processing;Difficulty sequencing;Decreased initiation;Requires verbal cues;Requires tactile cues General Comments: pt pleasantly confused. Pt states she lives by herself but poor historian of PLOF and of any DME or A/E   General Comments   pt pleasantly confused               Home Living Family/patient expects to be discharged to:: Skilled nursing facility Living Arrangements: Alone                               Additional Comments: pt was living alone      Prior Functioning/Environment Level of Independence: Independent        Comments: granddaughter reports pt to be living alone. pt poor historian due to confusion        OT Problem List: Decreased strength;Impaired balance (sitting and/or standing);Decreased cognition;Pain;Decreased safety awareness;Decreased activity tolerance;Decreased range of motion;Decreased knowledge of use of DME or AE      OT Treatment/Interventions: Self-care/ADL training;DME and/or AE instruction;Therapeutic activities;Therapeutic exercise;Patient/family education    OT Goals(Current goals can be found in the care plan section) Acute Rehab OT Goals Patient Stated Goal: none stated OT Goal Formulation: With patient Time For Goal Achievement: 11/17/16 Potential to Achieve Goals: Good ADL Goals Pt Will Perform Grooming: with supervision;with set-up;sitting Pt Will Perform Upper Body Bathing: with min guard assist;with supervision;sitting Pt Will Perform Lower Body Bathing: with mod assist;sitting/lateral leans Pt Will Perform Upper Body Dressing: with min guard assist;sitting Pt Will Transfer to Toilet: with min assist;bedside commode;stand pivot transfer Pt  Will Perform Toileting - Clothing Manipulation and hygiene: with max assist;with mod assist;sitting/lateral leans;sit to/from stand  OT Frequency: Min 2X/week   Barriers to D/C: Decreased caregiver support                        AM-PAC PT "6 Clicks" Daily Activity     Outcome Measure Help from another person eating meals?: A Little Help from another person taking care of personal grooming?: A Little Help from another person toileting, which includes using toliet, bedpan, or urinal?: Total Help from another person bathing (including washing, rinsing, drying)?: Total Help from another person to put on and taking off regular upper body clothing?: A Little Help from another person to put on and taking off regular lower body clothing?: Total 6 Click Score: 12   End of Session    Activity Tolerance: Patient limited by pain Patient left: in bed;with call bell/phone within reach;with bed alarm set  OT Visit Diagnosis: Muscle weakness (generalized) (M62.81);Pain Pain - Right/Left: Right Pain - part of body: Hip (B shoulders and back as well)                Time: 9628-3662 OT Time Calculation (min): 28 min Charges:  OT General Charges $OT Visit: 1 Procedure OT Evaluation $OT Eval Moderate Complexity: 1 Procedure OT Treatments $Therapeutic Activity: 8-22 mins G-Codes: OT G-codes **NOT FOR INPATIENT CLASS** Functional Assessment Tool Used: AM-PAC 6 Clicks Daily Activity     Britt Bottom 11/10/2016,  2:13 PM

## 2016-11-11 DIAGNOSIS — M549 Dorsalgia, unspecified: Secondary | ICD-10-CM

## 2016-11-11 LAB — BASIC METABOLIC PANEL
ANION GAP: 7 (ref 5–15)
BUN: 19 mg/dL (ref 6–20)
CALCIUM: 8 mg/dL — AB (ref 8.9–10.3)
CO2: 22 mmol/L (ref 22–32)
Chloride: 106 mmol/L (ref 101–111)
Creatinine, Ser: 0.74 mg/dL (ref 0.44–1.00)
Glucose, Bld: 94 mg/dL (ref 65–99)
Potassium: 3.8 mmol/L (ref 3.5–5.1)
Sodium: 135 mmol/L (ref 135–145)

## 2016-11-11 LAB — CBC
HCT: 36.4 % (ref 36.0–46.0)
Hemoglobin: 12.1 g/dL (ref 12.0–15.0)
MCH: 29.4 pg (ref 26.0–34.0)
MCHC: 33.2 g/dL (ref 30.0–36.0)
MCV: 88.3 fL (ref 78.0–100.0)
PLATELETS: 190 10*3/uL (ref 150–400)
RBC: 4.12 MIL/uL (ref 3.87–5.11)
RDW: 14.7 % (ref 11.5–15.5)
WBC: 4.9 10*3/uL (ref 4.0–10.5)

## 2016-11-11 LAB — URINE CULTURE: Culture: >=100000 — AB

## 2016-11-11 MED ORDER — TRAMADOL HCL 50 MG PO TABS: 50.0000 mg | ORAL_TABLET | Freq: Two times a day (BID) | ORAL | 0 refills | Status: AC | PRN

## 2016-11-11 MED ORDER — CEFUROXIME AXETIL 500 MG PO TABS: 500.0000 mg | ORAL_TABLET | Freq: Two times a day (BID) | ORAL | 0 refills | Status: AC

## 2016-11-11 NOTE — NC FL2 (Signed)
[place] LEVEL OF CARE SCREENING TOOL     IDENTIFICATION  Patient Name: Alicia Roth Birthdate: 17-Sep-1930 Sex: female Admission Date (Current Location): 11/08/2016  [facility] and [place] Number:  [employer] and Address:  [facility], [place] [address] [place], [place] [address]      Provider Number: [id number]  Attending Physician Name and Address:  Cristal Ford, DO  Relative Name and Phone Number:       Current Level of Care: Hospital Recommended Level of Care: [place] Prior Approval Number:    Date Approved/Denied:   PASRR Number: Manual review  Discharge Plan: SNF    Current Diagnoses: Patient Active Problem List   Diagnosis Date Noted  . Osteoporosis   . Hypertension   . Depression   . AKI (acute kidney injury) ([place])   . Dehydration   . Lower urinary tract infectious disease   . Sepsis due to urinary tract infection ([place]) 11/08/2016    Orientation RESPIRATION BLADDER Height & Weight     Self  Normal Continent Weight: 164 lb 3.2 oz (74.5 kg) Height:  5\' 5"  (165.1 cm)  BEHAVIORAL SYMPTOMS/MOOD NEUROLOGICAL BOWEL NUTRITION STATUS   (None)  (None) Continent Diet (Heart healthy)  AMBULATORY STATUS COMMUNICATION OF NEEDS Skin   Limited Assist Verbally Bruising, Other (Comment) (MASD, Rash.)                       Personal Care Assistance Level of Assistance  Bathing, Feeding, Dressing Bathing Assistance: Maximum assistance Feeding assistance: Limited assistance Dressing Assistance: Maximum assistance     Functional Limitations Info  Sight, Hearing, Speech Sight Info: Adequate Hearing Info: Adequate Speech Info: Adequate    SPECIAL CARE FACTORS FREQUENCY  PT (By licensed PT), Blood pressure, OT (By licensed OT)     PT Frequency: 5 x week OT Frequency: 5 x week            Contractures Contractures Info: Not present    Additional Factors Info  Code Status, Allergies,  Psychotropic Code Status Info: Full Allergies Info: Codeine, Diazepam, Gabapentin, Indomethacin Psychotropic Info: Depression: Paxil 10 mg PO daily         Current Medications (11/11/2016):  This is the current hospital active medication list Current Facility-Administered Medications  Medication Dose Route Frequency Provider Last Rate Last Dose  . aspirin tablet 325 mg  325 mg Oral Daily Etta Quill, DO   325 mg at 11/11/16 1021  . cefTRIAXone (ROCEPHIN) 1 g in dextrose 5 % 50 mL IVPB  1 g Intravenous Q24H Laren Everts, RPH   Stopped at 11/11/16 0540  . donepezil (ARICEPT) tablet 10 mg  10 mg Oral QHS Jennette Kettle M, DO   10 mg at 11/10/16 2302  . enoxaparin (LOVENOX) injection 40 mg  40 mg Subcutaneous Q24H Mikhail, [place], DO   40 mg at 11/11/16 1021  . guaiFENesin tablet 400 mg  400 mg Oral Q4H Jennette Kettle M, DO   400 mg at 11/11/16 1021  . PARoxetine (PAXIL) tablet 10 mg  10 mg Oral Daily Jennette Kettle M, DO   10 mg at 11/11/16 1021  . phenazopyridine (PYRIDIUM) tablet 100 mg  100 mg Oral TID PRN Etta Quill, DO      . sodium chloride flush (NS) 0.9 % injection 3 mL  3 mL Intravenous Q12H Gilles Chiquito B, MD   3 mL at 11/10/16 2302  . traMADol (  ULTRAM) tablet 50 mg  50 mg Oral Q12H PRN Sid Falcon, MD   50 mg at 11/10/16 2010     Discharge Medications: Please see discharge summary for a list of discharge medications.  Relevant Imaging Results:  Relevant Lab Results:   Additional Information SS#: [id number]  Candie Chroman, LCSW

## 2016-11-11 NOTE — Discharge Instructions (Signed)

## 2016-11-11 NOTE — Clinical Social Work Note (Signed)
Clinical Social Work Assessment  Patient Details  Name: Alicia Roth MRN: [id number] Date of Birth: March 30, 1931  Date of referral:  11/11/16               Reason for consult:  Facility Placement, Discharge Planning                Permission sought to share information with:  Facility Sport and exercise psychologist, Family Supports Permission granted to share information::  Yes, Verbal Permission Granted  Name::     Collier Flowers  Agency::  SNF's  Relationship::  Daughter  Contact Information:  [phone]  Housing/Transportation Living arrangements for the past 2 months:  Single Family Home Source of Information:  Medical Team, Adult Children Patient Interpreter Needed:  None Criminal Activity/Legal Involvement Pertinent to Current Situation/Hospitalization:  No - Comment as needed Significant Relationships:  Adult Children, Other Family Members Lives with:  Self Do you feel safe going back to the place where you live?  Yes Need for family participation in patient care:  Yes (Comment)  Care giving concerns:  PT recommending SNF placement once medically stable for discharge.   Social Worker assessment / plan:  CSW met with patient. Daughter at bedside. Patient oriented to self only. CSW introduced role and explained that PT recommendations would be discussed. Patient's daughter agreeable to SNF placement but would like to discuss it with her brother and sister. CSW provided contact card for her to call CSW when decision made. SNF list provided for review. PASARR is pending. Patient cannot discharge to SNF until PASARR obtained. No further concerns. CSW encouraged patient's daughter to contact CSW as needed. CSW will continue to follow patient and her family for support and facilitate discharge to SNF once medically stable.  Employment status:  Retired Nurse, adult PT Recommendations:  [place] / Referral to community resources:  [place]  Patient/Family's Response to care:  Patient oriented to self only. Patient's daughter agreeable to SNF placement but would like to discuss it with her siblings. Patient's family supportive and involved in patient's care. Patient's daughter appreciated social work intervention.  Patient/Family's Understanding of and Emotional Response to Diagnosis, Current Treatment, and Prognosis:  Patient oriented to self only. Patient's daughter has a good understanding of the reason for admission and her need for rehab prior to returning home. Patient's daughter appears happy with hospital care.  Emotional Assessment Appearance:  Appears stated age Attitude/Demeanor/Rapport:  Unable to Assess Affect (typically observed):  Unable to Assess Orientation:  Oriented to Self Alcohol / Substance use:  Never Used Psych involvement (Current and /or in the community):  No (Comment)  Discharge Needs  Concerns to be addressed:  Care Coordination Readmission within the last 30 days:  No Current discharge risk:  Cognitively Impaired, Dependent with Mobility, Lives alone Barriers to Discharge:  Continued Medical Work up, Programmer, applications ([place])   Candie Chroman, LCSW 11/11/2016, 12:43 PM

## 2016-11-11 NOTE — Discharge Summary (Signed)
Physician Discharge Summary  Alicia Roth:[id number] DOB: November 07, 1930 DOA: 11/08/2016  PCP: Alicia Downing, MD  Admit date: 11/08/2016 Discharge date: 11/11/2016  Time spent: 45 minutes  Recommendations for Outpatient Follow-up:  Patient will be discharged to skilled nursing facility, continue physical and occupational therapy.  Patient will need to follow up with primary care provider within one week of discharge, CMP.  Patient should continue medications as prescribed.  Patient should follow a heart healthy diet.   Discharge Diagnoses:  Sepsis secondary to UTI Falls Acute metabolic encephalopathy Acute kidney injury Back wound Dementia  Essential hypertension Depression Osteoporosis  Mildly elevated AST Hypokalemia Goals of care  Discharge Condition: Stable   Diet recommendation: heart healthy  Filed Weights   11/09/16 0252 11/11/16 0442  Weight: 68.9 kg (151 lb 14.4 oz) 74.5 kg (164 lb 3.2 oz)    History of present illness:  On 11/08/2016 by Dr. Cathie Olden Roth a 83 y.o.femalewith medical history significant of osteoporosis, chronic arthralgia/pain, dementia, HTN, depression who presents after being found down. [name] was last seen normal on Thursday by one of her sons. She noted at that time that she had not felt well for the entire week. She was seen also by a neighbor on Thursday. She was not seen again for a few days. Family became worried when they could not reach her and they made entry into her house today and found her down in the kitchen, moaning and confused. She had multiple bruises and contusions and was complaining of pain. She has presumed dementia and memory issues at baseline, she is on donepazil. However, per daughter Alicia Roth, she does not take her medications regularly and will often forget them. It is not clear what led up to her being found down, but she has now been found to have an abnormal UA and acute renal failure.  She has a large family with multiple children. Daughters Alicia Roth and Alicia Roth were present in room and supplied some of the history. The patient was awake to voice, but would not open eyes. She answered some questions, but was confused. Reported being in pain but could not tell me where.  Hospital Course:  Sepsis secondary to UTI -On admission patient was febrile with tachycardia, vitals sounds improving  -UA: Many bacteria, TNTC WBC, large leukocytes, positive nitrites -Blood cultures show no growth to date -Urine culture >100K GNR (not ordered on admission, spoke with lab to add onto previous urine collected) -UDS negative -She was given Zosyn and vancomycin emergency department, however placed on ceftriaxone -Will discharge patient with ceftin 500mg  BID  Falls -CT head/neck showed no acute fractures, small contusion on back of head -CK 505, will continue to trend and continue IVF -TSH 1.114 -PT recommended SNF, social work consulted -?syncope  -Troponins cycled and negative thus far  Acute metabolic encephalopathy -Likely secondary to UTI -Discussed with family at bedside, patient was normally very functional and living alone, however a couple of days prior to admission, patient's mental status began to worsen.  Acute kidney injury -Resolved, likely secondary to sepsis vs medications -Creatinine upon admission 1.23, baseline Creatinine 0.7-0.8 -Currently creatinine 0.74 -Repeat CMP in one week  Back wound/ Thoracic Spine Compression fractures -likely from falling down -Family concerned for infection.  -Currently erythema noted on lumbar region, with minimal serosanguinous drainage -has small edematous area on thoracic spine, obtained thoracic xray: remote T8 and T12 compression fractures and right rib fracture, no acute abnormality  -Continue air mattress -Continue  wound care -Would continue to turn patient frequently to avoid wound progression/breakdown  -Discussed  compression fractures with interventional radiologist, Dr. Pascal Roth. Reviewed Xray and compared it to CT results from 3 months ago. T12 fracture: nothing can be done given that patient has had fusion above and below that level. T8 fracture looks similar to CT scan, not much change in height difference. Patient can have an MRI if kyphoplasty is desired, however, given her shoulder surgery it may be difficult to position patient for kyphoplasty. Can be monitored as an outpatient.   Dementia  -Continue donepezil  Essential hypertension -lisinopril held in the setting of sepsis and acute kidney injury -may restart lisinopril at discharge -BP stable, currently 130/71  Depression -Continue paxil  Osteoporosis  -no fractures noted on xrays -Continue pain control as needed  Mildly elevated AST -Unknown source, Possibly secondary to sepsis -currently trending downward -Repeat CMP in one week  Hypokalemia -Resolved, repeat CMP in one week  Goals of care -Given patient's dementia, urged patient's family to discuss goals of care and CODE STATUS.  Procedures: None  Consultations: Intervent  Discharge Exam: Vitals:   11/11/16 0442 11/11/16 1209  BP: 130/71 105/63  Pulse: 88 87  Resp: 18 18  Temp: 98.2 F (36.8 C) (!) 97.2 F (36.2 C)  SpO2: 100% 100%   (Has dementia) Patient feeling better today. States she wants to go home. Complains of back pain. Denies chest pain, shortness of breath, abdominal pain.    General: Well developed, well nourished, NAD, appears stated age  83: NCAT, mucous membranes moist. Mild bruising around right eye  Cardiovascular: S1 S2 auscultated, RRR  Respiratory: Clear to auscultation bilaterally with equal chest rise, no wheezing or rhonchi  Abdomen: Soft, nontender, nondistended, + bowel sounds  Extremities: warm dry without cyanosis clubbing or edema  Neuro: AAOx1 (self), nonfocal  MSK: thoracic region (around T10) with edema,  TTP  Skin: lumbar region with erythema (improving) and mild serosanguineous drainage. Bruising in various areas  Psych: Appropriate and pleasant  Discharge Instructions Discharge Instructions    Discharge instructions    Complete by:  As directed    Patient will be discharged to skilled nursing facility, continue physical and occupational therapy.  Patient will need to follow up with primary care provider within one week of discharge, repeat CMP.  Patient should continue medications as prescribed.  Patient should follow a heart healthy diet.     Current Discharge Medication List    START taking these medications   Details  cefUROXime (CEFTIN) 500 MG tablet Take 1 tablet (500 mg total) by mouth 2 (two) times daily. Qty: 8 tablet, Refills: 0    traMADol (ULTRAM) 50 MG tablet Take 1 tablet (50 mg total) by mouth every 12 (twelve) hours as needed for moderate pain. Qty: 30 tablet, Refills: 0      CONTINUE these medications which have NOT CHANGED   Details  acetaminophen (TYLENOL) 500 MG tablet Take 500 mg by mouth every 6 (six) hours as needed for mild pain.    aspirin 325 MG tablet Take 325 mg by mouth daily.    donepezil (ARICEPT) 10 MG tablet Take 10 mg by mouth at bedtime.     guaifenesin (HUMIBID E) 400 MG TABS tablet Take 400 mg by mouth every 4 (four) hours.    lisinopril (PRINIVIL,ZESTRIL) 20 MG tablet Take 20 mg by mouth daily.    naproxen sodium (ANAPROX) 220 MG tablet Take 220 mg by mouth 2 (two) times daily  with a meal.    PARoxetine (PAXIL) 20 MG tablet Take 10 mg by mouth daily.    phenazopyridine (URISTAT) 95 MG tablet Take 95 mg by mouth 3 (three) times daily as needed for pain.    sodium chloride (OCEAN) 0.65 % SOLN nasal spray Place 1 spray into both nostrils as needed for congestion.    OVER THE COUNTER MEDICATION Take 1 tablet by mouth daily. Over the counter vitamin for bones       Allergies  Allergen Reactions  . Codeine     unknown  . Diazepam      unknown  . Gabapentin     unknown  . Indomethacin     unknown   Follow-up Information    Alicia Downing, MD. Schedule an appointment as soon as possible for a visit in 1 week(s).   Specialty:  Family Medicine Why:  Hospital follow up Contact information: [address] [place] [address] [phone]            The results of significant diagnostics from this hospitalization (including imaging, microbiology, ancillary and laboratory) are listed below for reference.    Significant Diagnostic Studies: Dg Chest 1 View  Result Date: 11/08/2016 CLINICAL DATA:  Acute onset of fever.  Initial encounter. EXAM: CHEST 1 VIEW COMPARISON:  Chest radiograph performed 05/30/2014 FINDINGS: The lungs are well-aerated and clear. There is no evidence of focal opacification, pleural effusion or pneumothorax. The cardiomediastinal silhouette is borderline enlarged. No acute osseous abnormalities are seen. Thoracolumbar spinal fusion hardware is noted. The patient's right shoulder arthroplasty appears grossly unremarkable. Degenerative change is noted at the left glenohumeral joint. IMPRESSION: Borderline cardiomegaly.  Lungs remain grossly clear. Electronically Signed   By: Garald Balding M.D.   On: 11/08/2016 21:42   Dg Thoracic Spine 2 View  Result Date: 11/10/2016 CLINICAL DATA:  Chronic thoracic spine pain. EXAM: THORACIC SPINE 2 VIEWS COMPARISON:  CT thoracic spine 08/19/2016. FINDINGS: Spinal fusion hardware extending from T9 into the lumbar spine is incompletely imaged. Mild compression fracture of T8 and severe compression fracture of T12 are unchanged. There is no new fracture. Alignment is maintained. Intervertebral disc space height is maintained. Remote right rib fractures are incidentally noted. IMPRESSION: No acute abnormality. Remote T8 and T12 compression fractures. Partial visualization of thoracolumbar fusion hardware. Remote right rib fractures. Electronically Signed   By:  Inge Rise M.D.   On: 11/10/2016 09:46   Ct Head Wo Contrast  Result Date: 11/08/2016 CLINICAL DATA:  Patient found down on floor more so L2 clothing. Patient is disoriented. EXAM: CT HEAD WITHOUT CONTRAST CT CERVICAL SPINE WITHOUT CONTRAST TECHNIQUE: Multidetector CT imaging of the head and cervical spine was performed following the standard protocol without intravenous contrast. Multiplanar CT image reconstructions of the cervical spine were also generated. COMPARISON:  08/20/2006 CT. FINDINGS: CT HEAD FINDINGS Brain: Sulcal and ventricular prominence in keeping with chronic atrophy. No acute intracranial hemorrhage, midline shift or edema. No large vascular territory infarction. No evidence hydrocephalus. Midline fourth ventricle and basal cisterns. Vascular: No hyperdense vessel or unexpected calcification. Moderate atherosclerosis of the carotid siphons. Skull: No skull fracture Sinuses/Orbits: Intact orbits and globes.  No acute sinus disease. Other: Scalp contusion overlying the occiput. CT CERVICAL SPINE FINDINGS Alignment: Minimal grade 1 anterolisthesis of T1 on T2, likely on the basis of degenerative facet arthropathy. No posttraumatic malalignment. Skull base and vertebrae: Negative for skull or cervical spine fracture. Soft tissues and spinal canal: No prevertebral fluid or swelling. No  visible canal hematoma. Disc levels: Diffuse disc space narrowing and facet arthropathy most prominent from C3 through C7 with small posterior disc - osteophyte complexes. No jumped or perched facets. Upper chest: Negative. Other: None IMPRESSION: 1. Chronic stable cerebral atrophy without acute intracranial abnormality or skull fracture. 2. Small occipital scalp contusion. 3. Cervical spondylosis without acute posttraumatic fracture or subluxations. Electronically Signed   By: Ashley Royalty M.D.   On: 11/08/2016 22:57   Ct Cervical Spine Wo Contrast  Result Date: 11/08/2016 CLINICAL DATA:  Patient found down  on floor more so L2 clothing. Patient is disoriented. EXAM: CT HEAD WITHOUT CONTRAST CT CERVICAL SPINE WITHOUT CONTRAST TECHNIQUE: Multidetector CT imaging of the head and cervical spine was performed following the standard protocol without intravenous contrast. Multiplanar CT image reconstructions of the cervical spine were also generated. COMPARISON:  08/20/2006 CT. FINDINGS: CT HEAD FINDINGS Brain: Sulcal and ventricular prominence in keeping with chronic atrophy. No acute intracranial hemorrhage, midline shift or edema. No large vascular territory infarction. No evidence hydrocephalus. Midline fourth ventricle and basal cisterns. Vascular: No hyperdense vessel or unexpected calcification. Moderate atherosclerosis of the carotid siphons. Skull: No skull fracture Sinuses/Orbits: Intact orbits and globes.  No acute sinus disease. Other: Scalp contusion overlying the occiput. CT CERVICAL SPINE FINDINGS Alignment: Minimal grade 1 anterolisthesis of T1 on T2, likely on the basis of degenerative facet arthropathy. No posttraumatic malalignment. Skull base and vertebrae: Negative for skull or cervical spine fracture. Soft tissues and spinal canal: No prevertebral fluid or swelling. No visible canal hematoma. Disc levels: Diffuse disc space narrowing and facet arthropathy most prominent from C3 through C7 with small posterior disc - osteophyte complexes. No jumped or perched facets. Upper chest: Negative. Other: None IMPRESSION: 1. Chronic stable cerebral atrophy without acute intracranial abnormality or skull fracture. 2. Small occipital scalp contusion. 3. Cervical spondylosis without acute posttraumatic fracture or subluxations. Electronically Signed   By: Ashley Royalty M.D.   On: 11/08/2016 22:57   Dg Hip Unilat W Or Wo Pelvis 2-3 Views Left  Result Date: 11/08/2016 CLINICAL DATA:  Found down, lying on back on kitchen floor. Initial encounter. EXAM: DG HIP (WITH OR WITHOUT PELVIS) 2-3V LEFT COMPARISON:  None.  FINDINGS: There is no evidence of fracture or dislocation. Both femoral heads are seated normally within their respective acetabula. The proximal left femur appears intact. No significant degenerative change is appreciated. The sacroiliac joints are unremarkable in appearance. Visualized lumbar spinal fusion hardware is grossly unremarkable. The visualized bowel gas pattern is grossly unremarkable in appearance. IMPRESSION: No evidence of fracture or dislocation. Electronically Signed   By: Garald Balding M.D.   On: 11/08/2016 21:43    Microbiology: Recent Results (from the past 240 hour(s))  Blood Culture (routine x 2)     Status: None (Preliminary result)   Collection Time: 11/08/16  9:00 PM  Result Value Ref Range Status   Specimen Description BLOOD LEFT ANTECUBITAL  Final   Special Requests   Final    BOTTLES DRAWN AEROBIC AND ANAEROBIC Blood Culture adequate volume   Culture NO GROWTH 3 DAYS  Final   Report Status PENDING  Incomplete  Urine Culture     Status: Abnormal   Collection Time: 11/08/16  9:01 PM  Result Value Ref Range Status   Specimen Description URINE, CATHETERIZED  Final   Special Requests NONE  Final   Culture >=100,000 COLONIES/mL ESCHERICHIA COLI (A)  Final   Report Status 11/11/2016 FINAL  Final   Organism ID,  Bacteria ESCHERICHIA COLI (A)  Final      Susceptibility   Escherichia coli - MIC*    AMPICILLIN <=2 SENSITIVE Sensitive     CEFAZOLIN <=4 SENSITIVE Sensitive     CEFTRIAXONE <=1 SENSITIVE Sensitive     CIPROFLOXACIN <=0.25 SENSITIVE Sensitive     GENTAMICIN <=1 SENSITIVE Sensitive     IMIPENEM <=0.25 SENSITIVE Sensitive     NITROFURANTOIN 32 SENSITIVE Sensitive     TRIMETH/SULFA <=20 SENSITIVE Sensitive     AMPICILLIN/SULBACTAM <=2 SENSITIVE Sensitive     PIP/TAZO <=4 SENSITIVE Sensitive     Extended ESBL NEGATIVE Sensitive     * >=100,000 COLONIES/mL ESCHERICHIA COLI  Blood Culture (routine x 2)     Status: None (Preliminary result)   Collection  Time: 11/08/16 10:24 PM  Result Value Ref Range Status   Specimen Description BLOOD RIGHT HAND  Final   Special Requests   Final    BOTTLES DRAWN AEROBIC AND ANAEROBIC Blood Culture adequate volume   Culture NO GROWTH 3 DAYS  Final   Report Status PENDING  Incomplete     Labs: Basic Metabolic Panel:  Recent Labs Lab 11/08/16 2100 11/09/16 0302 11/10/16 0410 11/11/16 0346  NA 139 144 140 135  K 3.6 3.8 3.4* 3.8  CL 108 112* 110 106  CO2 19* 24 23 22   GLUCOSE 164* 150* 96 94  BUN 39* 36* 27* 19  CREATININE 1.23* 1.09* 0.82 0.74  CALCIUM 8.7* 8.4* 8.2* 8.0*   Liver Function Tests:  Recent Labs Lab 11/08/16 2100 11/09/16 0302  AST 53* 47*  ALT 37 33  ALKPHOS 49 43  BILITOT 1.2 0.9  PROT 6.3* 5.7*  ALBUMIN 3.3* 3.0*   No results for input(s): LIPASE, AMYLASE in the last 168 hours. No results for input(s): AMMONIA in the last 168 hours. CBC:  Recent Labs Lab 11/08/16 2100 11/09/16 0302 11/10/16 0410 11/11/16 0346  WBC 10.9* 11.1* 5.8 4.9  NEUTROABS 8.3*  --   --   --   HGB 13.2 12.0 11.6* 12.1  HCT 39.0 36.1 35.4* 36.4  MCV 87.2 88.0 87.6 88.3  PLT 203 193 174 190   Cardiac Enzymes:  Recent Labs Lab 11/08/16 2100 11/09/16 0302 11/09/16 0953 11/09/16 1617  CKTOTAL 528* 505*  --   --   TROPONINI  --  0.03* 0.03* 0.03*   BNP: BNP (last 3 results) No results for input(s): BNP in the last 8760 hours.  ProBNP (last 3 results) No results for input(s): PROBNP in the last 8760 hours.  CBG: No results for input(s): GLUCAP in the last 168 hours.     SignedCristal Ford  [facility] 11/11/2016, 12:44 PM

## 2016-11-11 NOTE — Care Management Important Message (Signed)
Important Message  Patient Details  Name: Alicia Roth MRN: [id number] Date of Birth: Feb 02, 1931   Medicare Important Message Given:  Yes    Erenest Rasher, RN 11/11/2016, 2:56 PM

## 2016-11-11 NOTE — Clinical Social Work Placement (Signed)
   CLINICAL SOCIAL WORK PLACEMENT  NOTE  Date:  11/11/2016  Patient Details  Name: Alicia Roth MRN: [id number] Date of Birth: 1931-03-08  Clinical Social Work is seeking post-discharge placement for this patient at the [place] level of care (*CSW will initial, date and re-position this form in  chart as items are completed):  Yes   Patient/family provided with [place] Work Department's list of facilities offering this level of care within the geographic area requested by the patient (or if unable, by the patient's family).  Yes   Patient/family informed of their freedom to choose among providers that offer the needed level of care, that participate in Medicare, Medicaid or managed care program needed by the patient, have an available bed and are willing to accept the patient.  Yes   Patient/family informed of [place]'s ownership interest in [facility] and [facility], as well as of the fact that they are under no obligation to receive care at these facilities.  PASRR submitted to EDS on 11/11/16     PASRR number received on       Existing PASRR number confirmed on       FL2 transmitted to all facilities in geographic area requested by pt/family on 11/11/16     FL2 transmitted to all facilities within larger geographic area on       Patient informed that his/her managed care company has contracts with or will negotiate with certain facilities, including the following:            Patient/family informed of bed offers received.  Patient chooses bed at       Physician recommends and patient chooses bed at      Patient to be transferred to   on  .  Patient to be transferred to facility by       Patient family notified on   of transfer.  Name of family member notified:        PHYSICIAN Please sign FL2     Additional Comment:    _______________________________________________ Candie Chroman, LCSW 11/11/2016, 12:48 PM

## 2016-11-11 NOTE — Clinical Social Work Note (Addendum)
CSW left voicemails with both of the patient's children. Will discuss SNF placement when they return call.  Dayton Scrape, [place] [phone]  12:05 pm  PASARR under manual review due to depression diagnosis. Patient cannot go to SNF until PASARR obtained.  Dayton Scrape, [place]

## 2016-11-11 NOTE — Care Management Note (Signed)
Case Management Note  Patient Details  Name: Alicia Roth MRN: [id number] Date of Birth: 10-Jan-1931  Subjective/Objective:   Sepsis s/t UTI, falls, acute metabolic encephalopathy                 Action/Plan: Discharge Planning: Chart reviewed. CSW following for SNF-rehab. Scheduled dc to SNF.  PCP Leonard Downing MD  Expected Discharge Date:  11/11/16               Expected Discharge Plan:  [place]  In-House Referral:  Clinical Social Work  Discharge planning Services  CM Consult  Post Acute Care Choice:  NA Choice offered to:  NA  DME Arranged:  N/A DME Agency:  NA  HH Arranged:  NA HH Agency:  NA  Status of Service:  Completed, signed off  If discussed at [place] of Stay Meetings, dates discussed:    Additional Comments:  Erenest Rasher, RN 11/11/2016, 3:50 PM

## 2016-11-11 NOTE — Clinical Social Work Note (Addendum)
CSW discussed SNF placement with patient's son over the phone. He is not familiar with any facilities and also stated he would like to discuss placement with his sisters. CSW provided patient's daughter with bed offers so far. Out of those she was most interested in [facility] and [facility]. The admissions coordinator for [facility] is usually in the hospital so CSW offered to have her reach out to patient's daughter. Patient's daughter is agreeable. Patient's daughter's name and cell phone number given to admissions coordinator.  Dayton Scrape, CSW [phone]  2:35 pm H&P, FL2, and 30 day note faxed to [facility].  Dayton Scrape, CSW [phone]  4:30 pm PASARR still pending.  Dayton Scrape, [place]

## 2016-11-12 ENCOUNTER — Encounter (HOSPITAL_COMMUNITY): Payer: Self-pay | Admitting: General Practice

## 2016-11-12 DIAGNOSIS — N390 Urinary tract infection, site not specified: Secondary | ICD-10-CM

## 2016-11-12 NOTE — Progress Notes (Signed)
Pt is alert and confused daughter at the besides concerned about swallow right before discharge a swallow eval will be added to address at Nursing facility.

## 2016-11-12 NOTE — Progress Notes (Signed)
Gave Report to Cristie Hem the Nurse at [facility]. Transportation by [facility] scheduled around 4pm

## 2016-11-12 NOTE — Clinical Social Work Note (Addendum)
PASARR still pending. CSW received text message from patient's son last night, "Jasmine Pang, this is [employer]. We want to schedule a meeting with you Wednesday afternoon if possible. Late evening would be preferred say around 5 o'clock. My sister will be traveling from [place]. Let me know what works best in your schedule." CSW responded with a HIPAA compliant message stating that this CSW's day ends at 4:30 and the facility they choose will definitely need to know she is coming before that so they can prepare. CSW notified him that we are still waiting on state approval but if we get that today (which we should) and the MD determines she is still stable for discharge, then we will have to get that plan together for discharge today.  Dayton Scrape, [place] [phone]  8:47 am Patient's son thought we were supposed to meet to discuss care after rehab. CSW explained that that will be determined with the social worker at the facility. Patient's son stated that he is 99.9% sure they will choose [facility] but stated he wanted to confirm with his sister, Ms. Gretta Cool, first. He will notify CSW after he talks with her. CSW emailed patient's son and daughter-in-law ALF lists so they can start looking at facilities ahead of time in case they decide to go that route after discharge from SNF. PASARR obtained: [id number].  Dayton Scrape, [place] [phone]  10:10 am Patient's son confirmed that they have chosen [facility]. They can take patient today. CSW paged MD to confirm that patient is still stable for discharge today. Also notified that SNF needs an updated discharge summary with today's date on it.   Dayton Scrape, [place]

## 2016-11-12 NOTE — Progress Notes (Signed)
Physical Therapy Treatment Patient Details Name: Alicia Roth MRN: [id number] DOB: 02-Mar-1931 Today's Date: 11/12/2016    History of Present Illness Alicia Roth a 83 y.o.femalewith medical history significant of osteoporosis, chronic arthralgia/pain, dementia, HTN, depression who presents after being found down. Noted skin breakdown on sacrum and R hip for being down 24-48hours and being incontinent of urine and stool.    PT Comments    Patient continues to be pleasantly confused.  Patient required assistance for OOB mobility and oriented to person only. Pt able to ambulate very short distance. Current plan remains appropriate.   Follow Up Recommendations  SNF;Supervision/Assistance - 24 hour     Equipment Recommendations  Rolling walker with 5" wheels    Recommendations for Other Services       Precautions / Restrictions Precautions Precautions: Fall Precaution Comments: confusion Restrictions Weight Bearing Restrictions: No    Mobility  Bed Mobility               General bed mobility comments: pt OOB in chair upon arrival  Transfers Overall transfer level: Needs assistance Equipment used: Rolling walker (2 wheeled) Transfers: Sit to/from [facility] Sit to Stand: Min assist Stand pivot transfers: Min assist       General transfer comment: assist to stabilize RW and to power up into standing as well as manage RW when turning; cues for safe hand placement  Ambulation/Gait Ambulation/Gait assistance: Min assist Ambulation Distance (Feet):  (2-33ft X2) Assistive device: Rolling walker (2 wheeled) Gait Pattern/deviations: Step-through pattern;Decreased step length - right;Decreased step length - left;Trunk flexed Gait velocity: slow   General Gait Details: pt able to ambulate short distance to [facility] and back to recliner; cues for posture and proximity of RW with assist for balance   Stairs            Wheelchair Mobility     Modified Rankin (Stroke Patients Only)       Balance Overall balance assessment: Needs assistance Sitting-balance support: Feet supported;No upper extremity supported Sitting balance-Leahy Scale: Fair     Standing balance support: Bilateral upper extremity supported Standing balance-Leahy Scale: Poor Standing balance comment: requires external support or RW                            Cognition Arousal/Alertness: Awake/alert Behavior During Therapy: WFL for tasks assessed/performed Overall Cognitive Status: No family/caregiver present to determine baseline cognitive functioning Area of Impairment: Orientation;Attention;Memory;Following commands;Safety/judgement;Awareness;Problem solving                 Orientation Level: Disoriented to;Place;Time;Situation Current Attention Level: Sustained Memory: Decreased short-term memory Following Commands: Follows one step commands consistently;Follows one step commands with increased time Safety/Judgement: Decreased awareness of safety;Decreased awareness of deficits Awareness: Emergent Problem Solving: Slow processing;Difficulty sequencing;Decreased initiation;Requires verbal cues;Requires tactile cues General Comments: pt pleasantly confused. discussing past but unable to orient to present. pt did state she lives by herself but poor historian of PLOF      Exercises      General Comments General comments (skin integrity, edema, etc.): total A for pericare; skin breakdown and redness on bottom      Pertinent Vitals/Pain Pain Assessment: Faces Faces Pain Scale: Hurts little more Pain Location: back with movement, R hip at abrasion area Pain Descriptors / Indicators: Grimacing;Guarding;Sore Pain Intervention(s): Limited activity within patient's tolerance;Monitored during session;Repositioned    Home Living  Prior Function            PT Goals (current goals can now be found in  the care plan section) Progress towards PT goals: Progressing toward goals    Frequency    Min 3X/week      PT Plan Current plan remains appropriate    Co-evaluation              AM-PAC PT "6 Clicks" Daily Activity  Outcome Measure  Difficulty turning over in bed (including adjusting bedclothes, sheets and blankets)?: Total Difficulty moving from lying on back to sitting on the side of the bed? : Total Difficulty sitting down on and standing up from a chair with arms (e.g., wheelchair, bedside commode, etc,.)?: Total Help needed moving to and from a bed to chair (including a wheelchair)?: A Lot Help needed walking in hospital room?: A Little Help needed climbing 3-5 steps with a railing? : A Lot 6 Click Score: 10    End of Session Equipment Utilized During Treatment: Gait belt Activity Tolerance: Patient tolerated treatment well Patient left: in chair;with call bell/phone within reach;with chair alarm set Nurse Communication: Mobility status PT Visit Diagnosis: Muscle weakness (generalized) (M62.81);Difficulty in walking, not elsewhere classified (R26.2)     Time: 6759-1638 PT Time Calculation (min) (ACUTE ONLY): 33 min  Charges:  $Gait Training: 8-22 mins $Therapeutic Activity: 8-22 mins                    G Codes:       Earney Navy, PTA Pager: [phone]     Darliss Cheney 11/12/2016, 1:46 PM

## 2016-11-12 NOTE — Progress Notes (Signed)
Pt is alert and confused with calm behavior, Reported up in the chair last night was comfortable in the chair received a bath on night shift, vital stable plan for SNF today

## 2016-11-12 NOTE — Clinical Social Work Note (Addendum)
CSW facilitated patient discharge including contacting patient family (Pam and Ray) and facility to confirm patient discharge plans. Clinical information faxed to facility and family agreeable with plan. CSW arranged ambulance transport via [facility] to [facility]. RN to call report prior to discharge [phone] Room 307A).  CSW will sign off for now as social work intervention is no longer needed. Please consult [place] again if new needs arise.  Dayton Scrape, [place]  12:18 pm Per MD, patient's daughter wants swallow eval done before she leaves. RN to put in order for imminent discharge. SNF notified.  Dayton Scrape, CSW [phone]  12:46 pm CSW left a voicemail for acute rehab department to see if we could expedite evaluation since patient is discharging to SNF today.  Dayton Scrape, CSW [phone]  3:17 pm [facility] set up again. They will be here around 4:00 pm. SNF admissions coordinator, RN, and patient's daughter notified.  CSW signing off.  Dayton Scrape, [place]

## 2016-11-12 NOTE — Clinical Social Work Placement (Signed)
   CLINICAL SOCIAL WORK PLACEMENT  NOTE  Date:  11/12/2016  Patient Details  Name: Alicia Roth MRN: [id number] Date of Birth: 1930-09-09  Clinical Social Work is seeking post-discharge placement for this patient at the [place] level of care (*CSW will initial, date and re-position this form in  chart as items are completed):  Yes   Patient/family provided with [facility] Work Department's list of facilities offering this level of care within the geographic area requested by the patient (or if unable, by the patient's family).  Yes   Patient/family informed of their freedom to choose among providers that offer the needed level of care, that participate in Medicare, Medicaid or managed care program needed by the patient, have an available bed and are willing to accept the patient.  Yes   Patient/family informed of [facility]'s ownership interest in [facility] and [facility], as well as of the fact that they are under no obligation to receive care at these facilities.  PASRR submitted to [facility] on 11/11/16     PASRR number received on 11/12/16     Existing PASRR number confirmed on       FL2 transmitted to all facilities in geographic area requested by pt/family on 11/11/16     FL2 transmitted to all facilities within larger geographic area on       Patient informed that his/her managed care company has contracts with or will negotiate with certain facilities, including the following:        Yes   Patient/family informed of bed offers received.  Patient chooses bed at [facility], [place]     Physician recommends and patient chooses bed at      Patient to be transferred to [place], [place] on 11/12/16.  Patient to be transferred to facility by [facility]     Patient family notified on 11/12/16 of transfer.  Name of family member notified:  Pam and Ray     PHYSICIAN       Additional Comment:     _______________________________________________ Candie Chroman, LCSW 11/12/2016, 12:05 PM

## 2016-11-12 NOTE — Discharge Summary (Addendum)
Physician Discharge Summary  CELINA SHILEY QJJ:[id number] DOB: September 18, 1930 DOA: 11/08/2016  PCP: Leonard Downing, MD  Admit date: 11/08/2016 Discharge date: 11/12/2016  Time spent: 45 minutes  Recommendations for Outpatient Follow-up:  Patient will be discharged to skilled nursing facility, continue physical and occupational therapy.  Patient will need to follow up with primary care provider within one week of discharge, CMP.  Patient should continue medications as prescribed.  Patient should follow a heart healthy diet.  Follow up with SLP as outpatient at the facility.   Discharge Diagnoses:  Sepsis secondary to UTI Falls Acute metabolic encephalopathy Acute kidney injury Back wound Dementia  Essential hypertension Depression Osteoporosis  Mildly elevated AST Hypokalemia Goals of care  Discharge Condition: Stable   Diet recommendation: dysphagia 3 with thin liquids.   Filed Weights   11/09/16 0252 11/11/16 0442 11/12/16 0535  Weight: 68.9 kg (151 lb 14.4 oz) 74.5 kg (164 lb 3.2 oz) 68.9 kg (151 lb 14.4 oz)    History of present illness:  On 11/08/2016 by Dr. Cathie Olden Jessupis a 83 y.o.femalewith medical history significant of osteoporosis, chronic arthralgia/pain, dementia, HTN, depression who presents after being found down. Ms. Giacomo was last seen normal on Thursday by one of her sons. She noted at that time that she had not felt well for the entire week. Family became worried when they could not reach her and they made entry into her houseand found her down in the kitchen, moaning and confused. She had multiple bruises and contusions and was complaining of pain. She has presumed dementia and memory issues at baseline, she is on donepazil. However, per daughter Otila Kluver, she does not take her medications regularly and will often forget them. It is not clear what led up to her being found on the floor,  but she has now been found to have an abnormal UA  and acute renal failure. She was admitted for sepsis secondary to UTI, WAS treated with IV antibiotics and discharged on ceftin to complete the course.  Hospital Course:  Sepsis secondary to UTI -On admission patient was febrile with tachycardia, vitals sounds improving  -UA: Many bacteria, TNTC WBC, large leukocytes, positive nitrites -Blood cultures show no growth to date -Urine culture >100K GNR (not ordered on admission, spoke with lab to add onto previous urine collected) -UDS negative -She was given Zosyn and vancomycin emergency department, however placed on ceftriaxone -Will discharge patient with ceftin 500mg  BID  Falls -CT head/neck showed no acute fractures, small contusion on back of head -CK 505, will continue to trend and continue IVF -TSH 1.114 -PT recommended SNF, social work consulted -?syncope  -Troponins cycled and negative thus far  Acute metabolic encephalopathy -Likely secondary to UTI -Discussed with family at bedside, patient was normally very functional and living alone, however a couple of days prior to admission, patient's mental status began to worsen.  Acute kidney injury -Resolved, likely secondary to sepsis vs medications -Creatinine upon admission 1.23, baseline Creatinine 0.7-0.8 -Currently creatinine 0.74 -Repeat CMP in one week  Back wound/ Thoracic Spine Compression fractures -likely from falling down -Family concerned for infection.  -Currently erythema noted on lumbar region, with minimal serosanguinous drainage -has small edematous area on thoracic spine, obtained thoracic xray: remote T8 and T12 compression fractures and right rib fracture, no acute abnormality  -Continue air mattress -Continue wound care -Would continue to turn patient frequently to avoid wound progression/breakdown  -Hospitalist discussed compression fractures with interventional radiologist, Dr. Pascal Lux. Reviewed Xray  and compared it to CT results from 3 months ago.  T12 fracture: nothing can be done given that patient has had fusion above and below that level. T8 fracture looks similar to CT scan, not much change in height difference. Patient can have an MRI if kyphoplasty is desired, however, given her shoulder surgery it may be difficult to position patient for kyphoplasty. Can be monitored as an outpatient.   Dementia  -Continue donepezil  Essential hypertension Lisinopril was discontinued on admission for AKI.  Resume on discharge.   Depression -Continue paxil  Osteoporosis  -no fractures noted on xrays -Continue pain control as needed  Mildly elevated AST -Unknown source, Possibly secondary to sepsis -currently trending downward -Repeat CMP in one week  Hypokalemia -Resolved, repeat CMP in one week  Goals of care -Given patient's dementia, urged patient's family to discuss goals of care and CODE STATUS.  Procedures: None  Consultations: Intervent  Discharge Exam: Vitals:   11/11/16 2032 11/12/16 0535  BP: 113/63 121/65  Pulse: 96 82  Resp: 18 16  Temp: 97.6 F (36.4 C) (!) 97.5 F (36.4 C)  SpO2: 100% 100%   (Has dementia) Patient feeling better today. States she wants to go home. Complains of back pain. Denies chest pain, shortness of breath, abdominal pain.    General: Well developed, well nourished, NAD, appears stated age  83: NCAT, mucous membranes moist. Mild bruising around right eye  Cardiovascular: S1 S2 auscultated, RRR  Respiratory: Clear to auscultation bilaterally with equal chest rise, no wheezing or rhonchi  Abdomen: Soft, nontender, nondistended, + bowel sounds  Extremities: warm dry without cyanosis clubbing or edema  Neuro: AAOx1 (self), nonfocal  MSK: thoracic region (around T10) with edema, TTP  Skin: lumbar region with erythema (improving) and mild serosanguineous drainage. Bruising in various areas  Psych: Appropriate and pleasant  Discharge Instructions Discharge Instructions     Discharge instructions    Complete by:  As directed    Patient will be discharged to skilled nursing facility, continue physical and occupational therapy.  Patient will need to follow up with primary care provider within one week of discharge, repeat CMP.  Patient should continue medications as prescribed.  Patient should follow a heart healthy diet.     Current Discharge Medication List    START taking these medications   Details  cefUROXime (CEFTIN) 500 MG tablet Take 1 tablet (500 mg total) by mouth 2 (two) times daily. Qty: 8 tablet, Refills: 0    traMADol (ULTRAM) 50 MG tablet Take 1 tablet (50 mg total) by mouth every 12 (twelve) hours as needed for moderate pain. Qty: 30 tablet, Refills: 0      CONTINUE these medications which have NOT CHANGED   Details  acetaminophen (TYLENOL) 500 MG tablet Take 500 mg by mouth every 6 (six) hours as needed for mild pain.    aspirin 325 MG tablet Take 325 mg by mouth daily.    donepezil (ARICEPT) 10 MG tablet Take 10 mg by mouth at bedtime.     guaifenesin (HUMIBID E) 400 MG TABS tablet Take 400 mg by mouth every 4 (four) hours.    lisinopril (PRINIVIL,ZESTRIL) 20 MG tablet Take 20 mg by mouth daily.    naproxen sodium (ANAPROX) 220 MG tablet Take 220 mg by mouth 2 (two) times daily with a meal.    PARoxetine (PAXIL) 20 MG tablet Take 10 mg by mouth daily.    phenazopyridine (URISTAT) 95 MG tablet Take 95 mg by mouth 3 (  three) times daily as needed for pain.    sodium chloride (OCEAN) 0.65 % SOLN nasal spray Place 1 spray into both nostrils as needed for congestion.    OVER THE COUNTER MEDICATION Take 1 tablet by mouth daily. Over the counter vitamin for bones       Allergies  Allergen Reactions  . Codeine     unknown  . Diazepam     unknown  . Gabapentin     unknown  . Indomethacin     unknown    Contact information for follow-up providers    Leonard Downing, MD. Schedule an appointment as soon as possible for a  visit in 1 week(s).   Specialty:  Family Medicine Why:  Hospital follow up Contact information: [address] [place] [address] [phone]            Contact information for after-discharge care    Destination    [facility] .   Specialty:  [place] information: [place] [phone]                   The results of significant diagnostics from this hospitalization (including imaging, microbiology, ancillary and laboratory) are listed below for reference.    Significant Diagnostic Studies: Dg Chest 1 View  Result Date: 11/08/2016 CLINICAL DATA:  Acute onset of fever.  Initial encounter. EXAM: CHEST 1 VIEW COMPARISON:  Chest radiograph performed 05/30/2014 FINDINGS: The lungs are well-aerated and clear. There is no evidence of focal opacification, pleural effusion or pneumothorax. The cardiomediastinal silhouette is borderline enlarged. No acute osseous abnormalities are seen. Thoracolumbar spinal fusion hardware is noted. The patient's right shoulder arthroplasty appears grossly unremarkable. Degenerative change is noted at the left glenohumeral joint. IMPRESSION: Borderline cardiomegaly.  Lungs remain grossly clear. Electronically Signed   By: Garald Balding M.D.   On: 11/08/2016 21:42   Dg Thoracic Spine 2 View  Result Date: 11/10/2016 CLINICAL DATA:  Chronic thoracic spine pain. EXAM: THORACIC SPINE 2 VIEWS COMPARISON:  CT thoracic spine 08/19/2016. FINDINGS: Spinal fusion hardware extending from T9 into the lumbar spine is incompletely imaged. Mild compression fracture of T8 and severe compression fracture of T12 are unchanged. There is no new fracture. Alignment is maintained. Intervertebral disc space height is maintained. Remote right rib fractures are incidentally noted. IMPRESSION: No acute abnormality. Remote T8 and T12 compression fractures. Partial visualization of  thoracolumbar fusion hardware. Remote right rib fractures. Electronically Signed   By: Inge Rise M.D.   On: 11/10/2016 09:46   Ct Head Wo Contrast  Result Date: 11/08/2016 CLINICAL DATA:  Patient found down on floor more so L2 clothing. Patient is disoriented. EXAM: CT HEAD WITHOUT CONTRAST CT CERVICAL SPINE WITHOUT CONTRAST TECHNIQUE: Multidetector CT imaging of the head and cervical spine was performed following the standard protocol without intravenous contrast. Multiplanar CT image reconstructions of the cervical spine were also generated. COMPARISON:  08/20/2006 CT. FINDINGS: CT HEAD FINDINGS Brain: Sulcal and ventricular prominence in keeping with chronic atrophy. No acute intracranial hemorrhage, midline shift or edema. No large vascular territory infarction. No evidence hydrocephalus. Midline fourth ventricle and basal cisterns. Vascular: No hyperdense vessel or unexpected calcification. Moderate atherosclerosis of the carotid siphons. Skull: No skull fracture Sinuses/Orbits: Intact orbits and globes.  No acute sinus disease. Other: Scalp contusion overlying the occiput. CT CERVICAL SPINE FINDINGS Alignment: Minimal grade 1 anterolisthesis of T1 on T2, likely on the basis of degenerative facet  arthropathy. No posttraumatic malalignment. Skull base and vertebrae: Negative for skull or cervical spine fracture. Soft tissues and spinal canal: No prevertebral fluid or swelling. No visible canal hematoma. Disc levels: Diffuse disc space narrowing and facet arthropathy most prominent from C3 through C7 with small posterior disc - osteophyte complexes. No jumped or perched facets. Upper chest: Negative. Other: None IMPRESSION: 1. Chronic stable cerebral atrophy without acute intracranial abnormality or skull fracture. 2. Small occipital scalp contusion. 3. Cervical spondylosis without acute posttraumatic fracture or subluxations. Electronically Signed   By: Ashley Royalty M.D.   On: 11/08/2016 22:57   Ct  Cervical Spine Wo Contrast  Result Date: 11/08/2016 CLINICAL DATA:  Patient found down on floor more so L2 clothing. Patient is disoriented. EXAM: CT HEAD WITHOUT CONTRAST CT CERVICAL SPINE WITHOUT CONTRAST TECHNIQUE: Multidetector CT imaging of the head and cervical spine was performed following the standard protocol without intravenous contrast. Multiplanar CT image reconstructions of the cervical spine were also generated. COMPARISON:  08/20/2006 CT. FINDINGS: CT HEAD FINDINGS Brain: Sulcal and ventricular prominence in keeping with chronic atrophy. No acute intracranial hemorrhage, midline shift or edema. No large vascular territory infarction. No evidence hydrocephalus. Midline fourth ventricle and basal cisterns. Vascular: No hyperdense vessel or unexpected calcification. Moderate atherosclerosis of the carotid siphons. Skull: No skull fracture Sinuses/Orbits: Intact orbits and globes.  No acute sinus disease. Other: Scalp contusion overlying the occiput. CT CERVICAL SPINE FINDINGS Alignment: Minimal grade 1 anterolisthesis of T1 on T2, likely on the basis of degenerative facet arthropathy. No posttraumatic malalignment. Skull base and vertebrae: Negative for skull or cervical spine fracture. Soft tissues and spinal canal: No prevertebral fluid or swelling. No visible canal hematoma. Disc levels: Diffuse disc space narrowing and facet arthropathy most prominent from C3 through C7 with small posterior disc - osteophyte complexes. No jumped or perched facets. Upper chest: Negative. Other: None IMPRESSION: 1. Chronic stable cerebral atrophy without acute intracranial abnormality or skull fracture. 2. Small occipital scalp contusion. 3. Cervical spondylosis without acute posttraumatic fracture or subluxations. Electronically Signed   By: Ashley Royalty M.D.   On: 11/08/2016 22:57   Dg Hip Unilat W Or Wo Pelvis 2-3 Views Left  Result Date: 11/08/2016 CLINICAL DATA:  Found down, lying on back on kitchen floor.  Initial encounter. EXAM: DG HIP (WITH OR WITHOUT PELVIS) 2-3V LEFT COMPARISON:  None. FINDINGS: There is no evidence of fracture or dislocation. Both femoral heads are seated normally within their respective acetabula. The proximal left femur appears intact. No significant degenerative change is appreciated. The sacroiliac joints are unremarkable in appearance. Visualized lumbar spinal fusion hardware is grossly unremarkable. The visualized bowel gas pattern is grossly unremarkable in appearance. IMPRESSION: No evidence of fracture or dislocation. Electronically Signed   By: Garald Balding M.D.   On: 11/08/2016 21:43    Microbiology: Recent Results (from the past 240 hour(s))  Blood Culture (routine x 2)     Status: None (Preliminary result)   Collection Time: 11/08/16  9:00 PM  Result Value Ref Range Status   Specimen Description BLOOD LEFT ANTECUBITAL  Final   Special Requests   Final    BOTTLES DRAWN AEROBIC AND ANAEROBIC Blood Culture adequate volume   Culture NO GROWTH 3 DAYS  Final   Report Status PENDING  Incomplete  Urine Culture     Status: Abnormal   Collection Time: 11/08/16  9:01 PM  Result Value Ref Range Status   Specimen Description URINE, CATHETERIZED  Final   Special  Requests NONE  Final   Culture >=100,000 COLONIES/mL ESCHERICHIA COLI (A)  Final   Report Status 11/11/2016 FINAL  Final   Organism ID, Bacteria ESCHERICHIA COLI (A)  Final      Susceptibility   Escherichia coli - MIC*    AMPICILLIN <=2 SENSITIVE Sensitive     CEFAZOLIN <=4 SENSITIVE Sensitive     CEFTRIAXONE <=1 SENSITIVE Sensitive     CIPROFLOXACIN <=0.25 SENSITIVE Sensitive     GENTAMICIN <=1 SENSITIVE Sensitive     IMIPENEM <=0.25 SENSITIVE Sensitive     NITROFURANTOIN 32 SENSITIVE Sensitive     TRIMETH/SULFA <=20 SENSITIVE Sensitive     AMPICILLIN/SULBACTAM <=2 SENSITIVE Sensitive     PIP/TAZO <=4 SENSITIVE Sensitive     Extended ESBL NEGATIVE Sensitive     * >=100,000 COLONIES/mL ESCHERICHIA COLI    Blood Culture (routine x 2)     Status: None (Preliminary result)   Collection Time: 11/08/16 10:24 PM  Result Value Ref Range Status   Specimen Description BLOOD RIGHT HAND  Final   Special Requests   Final    BOTTLES DRAWN AEROBIC AND ANAEROBIC Blood Culture adequate volume   Culture NO GROWTH 3 DAYS  Final   Report Status PENDING  Incomplete     Labs: Basic Metabolic Panel:  Recent Labs Lab 11/08/16 2100 11/09/16 0302 11/10/16 0410 11/11/16 0346  NA 139 144 140 135  K 3.6 3.8 3.4* 3.8  CL 108 112* 110 106  CO2 19* 24 23 22   GLUCOSE 164* 150* 96 94  BUN 39* 36* 27* 19  CREATININE 1.23* 1.09* 0.82 0.74  CALCIUM 8.7* 8.4* 8.2* 8.0*   Liver Function Tests:  Recent Labs Lab 11/08/16 2100 11/09/16 0302  AST 53* 47*  ALT 37 33  ALKPHOS 49 43  BILITOT 1.2 0.9  PROT 6.3* 5.7*  ALBUMIN 3.3* 3.0*   No results for input(s): LIPASE, AMYLASE in the last 168 hours. No results for input(s): AMMONIA in the last 168 hours. CBC:  Recent Labs Lab 11/08/16 2100 11/09/16 0302 11/10/16 0410 11/11/16 0346  WBC 10.9* 11.1* 5.8 4.9  NEUTROABS 8.3*  --   --   --   HGB 13.2 12.0 11.6* 12.1  HCT 39.0 36.1 35.4* 36.4  MCV 87.2 88.0 87.6 88.3  PLT 203 193 174 190   Cardiac Enzymes:  Recent Labs Lab 11/08/16 2100 11/09/16 0302 11/09/16 0953 11/09/16 1617  CKTOTAL 528* 505*  --   --   TROPONINI  --  0.03* 0.03* 0.03*   BNP: BNP (last 3 results) No results for input(s): BNP in the last 8760 hours.  ProBNP (last 3 results) No results for input(s): PROBNP in the last 8760 hours.  CBG: No results for input(s): GLUCAP in the last 168 hours.     SignedHosie Poisson  [facility] 11/12/2016, 10:32 AM

## 2016-11-12 NOTE — Evaluation (Signed)
Clinical/Bedside Swallow Evaluation Patient Details  Name: Alicia Roth MRN: [id number] Date of Birth: Aug 15, 1930  Today's Date: 11/12/2016 Time: SLP Start Time (ACUTE ONLY): 3845 SLP Stop Time (ACUTE ONLY): 1410 SLP Time Calculation (min) (ACUTE ONLY): 18 min  Past Medical History:  Past Medical History:  Diagnosis Date  . Chronic pain syndrome   . Colitis   . Depression   . Hypertension   . Osteoporosis   . S/P dilatation of esophageal stricture    Past Surgical History:  Past Surgical History:  Procedure Laterality Date  . APPENDECTOMY    . BACK SURGERY     x3  . CARDIOVASCULAR STRESS TEST  11/20/2009   ef 76%, no ischemia  . CHOLECYSTECTOMY    . SHOULDER SURGERY    . VEIN LIGATION AND STRIPPING     HPI:  Alicia Roth a 83 y.o.femalewith medical history significant of osteoporosis, chronic arthralgia/pain, dementia, HTN, depression. Pt found to have sepsis secondary to a UTI.    Assessment / Plan / Recommendation Clinical Impression  Pt presents with a cognitive based dysphagia and has also had a history of esophageal dysphagia s/p dilation (last dilation on record 2007). Barium swallow noted in 2016, however barium tablet was unable to be administered secondary to positioning difficulties during procedure per epic documentation.   Pt pleasantly confused at bedside, swallow hx obtained from pts daughter. Pts swallow function appears to be episodic in nature with difficulty. Daughter reports episodic coughing with thin liquids "every once in a while". No overt s/sx of aspiration with any PO consistencies this date. Pt did exhibit prolonged mastication of solid PO and daughter states poor consumption with solids including meats with pt often complaining of lower gum discomfort from dentures when consuming solids. Recommend dysphagia 3 (mechanical soft) and thin liquids with medicines whole in puree. Further esophageal workup may be indicated per MD discretion as daughter  affirms frequent pt complaints in throat, though advanced cognitive deficits persist. Continued ST swallow intervention indicated at next level of care to ensure diet tolerance and aid in maximizing intake in setting of cognitive decline.   SLP Visit Diagnosis: Dysphagia, unspecified (R13.10)    Aspiration Risk  Mild aspiration risk    Diet Recommendation   Dysphagia 3 (mechanical soft) thin liquids   Medication Administration: Whole meds with puree    Other  Recommendations Recommended Consults: Consider esophageal assessment Oral Care Recommendations: Oral care BID   Follow up Recommendations Skilled Nursing facility      Frequency and Duration            Prognosis Prognosis for Safe Diet Advancement: Fair Barriers to Reach Goals: Cognitive deficits      Swallow Study   General Date of Onset: 11/08/16 HPI: Alicia Roth a 83 y.o.femalewith medical history significant of osteoporosis, chronic arthralgia/pain, dementia, HTN, depression. Pt found to have sepsis secondary to a UTI.  Type of Study: Bedside Swallow Evaluation Previous Swallow Assessment: esophageal imaging 2007 with dilation at that time Diet Prior to this Study: Regular;Thin liquids Temperature Spikes Noted: No Respiratory Status: Room air History of Recent Intubation: No Behavior/Cognition: Alert;Pleasant mood;Confused Oral Cavity Assessment: Within Functional Limits Oral Care Completed by SLP: Yes Oral Cavity - Dentition: Dentures, top;Dentures, bottom Self-Feeding Abilities: Needs assist (secondary to distractibility from advanced cognitive decline) Patient Positioning: Upright in chair Baseline Vocal Quality: Low vocal intensity Volitional Cough: Cognitively unable to elicit Volitional Swallow: Unable to elicit    Oral/Motor/Sensory Function Overall Oral Motor/Sensory Function:  Generalized oral weakness   Ice Chips Ice chips: Not tested   Thin Liquid Thin Liquid: Within functional limits     Nectar Thick Nectar Thick Liquid: Not tested   Honey Thick Honey Thick Liquid: Not tested   Puree Puree: Within functional limits   Solid   GO   Solid: Impaired Presentation: Self Fed Oral Phase Impairments: Impaired mastication Oral Phase Functional Implications: Prolonged oral transit Pharyngeal Phase Impairments: Suspected delayed Swallow;Multiple swallows       Arvil Chaco MA, CCC-SLP Acute Care Speech Language Pathologist    Levi Aland 11/12/2016,2:26 PM

## 2016-11-13 LAB — CULTURE, BLOOD (ROUTINE X 2)
Culture: NO GROWTH
Culture: NO GROWTH
Special Requests: ADEQUATE
Special Requests: ADEQUATE

## 2016-12-26 ENCOUNTER — Emergency Department (HOSPITAL_COMMUNITY)
Admission: EM | Admit: 2016-12-26 | Discharge: 2016-12-26 | Disposition: A | Discharge status: Home / Self Care | Payer: Medicare Other | Attending: Emergency Medicine | Admitting: Emergency Medicine

## 2016-12-26 ENCOUNTER — Emergency Department (HOSPITAL_COMMUNITY): Payer: Medicare Other

## 2016-12-26 ENCOUNTER — Encounter (HOSPITAL_COMMUNITY): Payer: Self-pay | Admitting: *Deleted

## 2016-12-26 DIAGNOSIS — W19XXXA Unspecified fall, initial encounter: Secondary | ICD-10-CM | POA: Diagnosis not present

## 2016-12-26 DIAGNOSIS — I10 Essential (primary) hypertension: Secondary | ICD-10-CM | POA: Insufficient documentation

## 2016-12-26 DIAGNOSIS — Z7982 Long term (current) use of aspirin: Secondary | ICD-10-CM | POA: Insufficient documentation

## 2016-12-26 DIAGNOSIS — Z79899 Other long term (current) drug therapy: Secondary | ICD-10-CM | POA: Diagnosis not present

## 2016-12-26 DIAGNOSIS — Y999 Unspecified external cause status: Secondary | ICD-10-CM | POA: Insufficient documentation

## 2016-12-26 DIAGNOSIS — F0390 Unspecified dementia without behavioral disturbance: Secondary | ICD-10-CM | POA: Insufficient documentation

## 2016-12-26 DIAGNOSIS — S52612A Displaced fracture of left ulna styloid process, initial encounter for closed fracture: Secondary | ICD-10-CM | POA: Diagnosis not present

## 2016-12-26 DIAGNOSIS — Z85828 Personal history of other malignant neoplasm of skin: Secondary | ICD-10-CM | POA: Diagnosis not present

## 2016-12-26 DIAGNOSIS — Y939 Activity, unspecified: Secondary | ICD-10-CM | POA: Insufficient documentation

## 2016-12-26 DIAGNOSIS — S59912A Unspecified injury of left forearm, initial encounter: Secondary | ICD-10-CM | POA: Diagnosis present

## 2016-12-26 DIAGNOSIS — Y92017 Garden or yard in single-family (private) house as the place of occurrence of the external cause: Secondary | ICD-10-CM | POA: Insufficient documentation

## 2016-12-26 DIAGNOSIS — S52602A Unspecified fracture of lower end of left ulna, initial encounter for closed fracture: Secondary | ICD-10-CM

## 2016-12-26 MED ORDER — ACETAMINOPHEN 500 MG PO TABS: 500.0000 mg | ORAL_TABLET | Freq: Four times a day (QID) | ORAL | 0 refills | Status: AC | PRN

## 2016-12-26 MED ORDER — IBUPROFEN 400 MG PO TABS: 400.0000 mg | ORAL_TABLET | Freq: Two times a day (BID) | ORAL | 0 refills | Status: AC | PRN

## 2016-12-26 MED ORDER — ACETAMINOPHEN 325 MG PO TABS
650.0000 mg | ORAL_TABLET | Freq: Once | ORAL | Status: AC
  Administered 2016-12-26: 650 mg via ORAL
  Filled 2016-12-26: qty 2

## 2016-12-26 NOTE — ED Triage Notes (Signed)
The pt fell  Wednesday in her yard and since then she has had pain and swelling in her lt wrist and hand with bruising  She klives alone and she works outside  often

## 2016-12-26 NOTE — Discharge Instructions (Signed)
You have a wrist fracture. Please call the orthopedic doctors for further evaluation.

## 2016-12-26 NOTE — ED Provider Notes (Addendum)
[facility] DEPT Provider Note   CSN: [id number] Arrival date & time: 12/26/16  1450     History   Chief Complaint Chief Complaint  Patient presents with  . Arm Injury    HPI Alicia Roth is a 83 y.o. female.  HPI Pt comes in with cc of fall. Pt fell 2 days ago, and it was a mechanical FOOSH type injury. Pt here with her daughter, and reports pain over the L wrist and forearm. Pt denies any head trauma, headache and she denies nausea, vomiting, seizures, loss of consciousness or new visual complains, weakness, numbness, dizziness or gait instability. Pt is not on blood thinners.   Past Medical History:  Diagnosis Date  . Anxiety   . Arthritis    "all over" (11/12/2016)  . Chronic back pain    "all over" (11/12/2016)  . Chronic pain syndrome   . Colitis   . Dementia    "never been dx'd but it's obvious" (11/12/2016)  . Depression   . Esophageal stricture   . Fibromyalgia   . Frequent UTI   . Hypertension   . Osteoporosis   . Pneumonia X 1  . Skin cancer    "back, LLE"    Patient Active Problem List   Diagnosis Date Noted  . Osteoporosis   . Hypertension   . Depression   . AKI (acute kidney injury) ([place])   . Dehydration   . Lower urinary tract infectious disease   . Sepsis due to urinary tract infection ([place]) 11/08/2016    Past Surgical History:  Procedure Laterality Date  . ABDOMINAL HYSTERECTOMY    . APPENDECTOMY    . BACK SURGERY  X 3   "upper back discs; upper back fusions; lower back w/rods and cages"  . CARDIOVASCULAR STRESS TEST  11/20/2009   ef 76%, no ischemia  . CATARACT EXTRACTION W/ INTRAOCULAR LENS  IMPLANT, BILATERAL Bilateral   . CHOLECYSTECTOMY    . CLOSED REDUCTION NASAL FRACTURE  11/2007   [name] 08/01/2010  . COLONOSCOPY    . ESOPHAGOGASTRODUODENOSCOPY (EGD) WITH ESOPHAGEAL DILATION  "several times"  . FRACTURE SURGERY    . HEMIARTHROPLASTY SHOULDER FRACTURE Right 03/12/2004   [name] 08/13/2010  . POSTERIOR LUMBAR FUSION  ?2003    "in [place]"  . SKIN CANCER EXCISION     "back, LLE"  . TUBAL LIGATION    . VEIN LIGATION AND STRIPPING      OB History    No data available       Home Medications    Prior to Admission medications   Medication Sig Start Date End Date Taking? Authorizing Provider  acetaminophen (TYLENOL) 500 MG tablet Take 1 tablet (500 mg total) by mouth every 6 (six) hours as needed for mild pain. 12/26/16   Varney Biles, MD  aspirin 325 MG tablet Take 325 mg by mouth daily.    [provider]  donepezil (ARICEPT) 10 MG tablet Take 10 mg by mouth at bedtime.  09/03/16   [provider]  guaifenesin (HUMIBID E) 400 MG TABS tablet Take 400 mg by mouth every 4 (four) hours.    [provider]  ibuprofen (ADVIL,MOTRIN) 400 MG tablet Take 1 tablet (400 mg total) by mouth every 12 (twelve) hours as needed. 12/26/16   Varney Biles, MD  lisinopril (PRINIVIL,ZESTRIL) 20 MG tablet Take 20 mg by mouth daily.    [provider]  naproxen sodium (ANAPROX) 220 MG tablet Take 220 mg by mouth 2 (two) times daily  with a meal.    [provider]  OVER THE COUNTER MEDICATION Take 1 tablet by mouth daily. Over the counter vitamin for bones    [provider]  PARoxetine (PAXIL) 20 MG tablet Take 10 mg by mouth daily.    [provider]  phenazopyridine (URISTAT) 95 MG tablet Take 95 mg by mouth 3 (three) times daily as needed for pain.    [provider]  sodium chloride (OCEAN) 0.65 % SOLN nasal spray Place 1 spray into both nostrils as needed for congestion.    [provider]  traMADol (ULTRAM) 50 MG tablet Take 1 tablet (50 mg total) by mouth every 12 (twelve) hours as needed for moderate pain. 11/11/16   Cristal Ford, DO    Family History Family History  Problem Relation Age of Onset  . CAD Father   . Heart failure Sister   . Rheumatic fever Brother     Social History Social History  Substance Use Topics  . Smoking  status: Never Smoker  . Smokeless tobacco: Never Used  . Alcohol use No     Allergies   Codeine; Diazepam; Gabapentin; and Indomethacin   Review of Systems Review of Systems  Constitutional: Negative for activity change.  Musculoskeletal: Positive for arthralgias and myalgias.  Neurological: Negative for headaches.  Hematological: Does not bruise/bleed easily.     Physical Exam Updated Vital Signs BP (!) 141/75   Pulse (!) 101   Temp 97.8 F (36.6 C) (Oral)   Resp 16   Ht 5\' 4"  (1.626 m)   Wt 68 kg (150 lb)   SpO2 96%   BMI 25.75 kg/m   Physical Exam  Constitutional: She is oriented to person, place, and time. She appears well-developed.  HENT:  Head: Normocephalic and atraumatic.  Eyes: EOM are normal.  Neck: Normal range of motion. Neck supple.  No midline c-spine tenderness, pt able to turn head to 45 degrees bilaterally without any pain and able to flex neck to the chest and extend without any pain or neurologic symptoms.   Cardiovascular: Normal rate and intact distal pulses.   Pulmonary/Chest: Effort normal.  Abdominal: Bowel sounds are normal.  Musculoskeletal:  Pt has tenderness over the distal forearm and her hand. Tenderness to palpation over the dorsal wrist and palm and over the styloid process. Pt is freely pronating and supinating when I examined her. She has focal tenderness over the distal elbow.  Neurological: She is alert and oriented to person, place, and time. No sensory deficit.  Skin: Skin is warm and dry.  Nursing note and vitals reviewed.    ED Treatments / Results  Labs (all labs ordered are listed, but only abnormal results are displayed) Labs Reviewed - No data to display  EKG  EKG Interpretation None       Radiology Dg Elbow Complete Left  Result Date: 12/26/2016 CLINICAL DATA:  Forearm pain EXAM: LEFT ELBOW - COMPLETE 3+ VIEW COMPARISON:  None. FINDINGS: No dislocation. No large elbow effusion. Questionable cortex  deformity of the distal humerus. IMPRESSION: 1. Questionable cortex deformity of the distal metaphysis of the humerus, correlate clinically for focal tenderness to the region. Otherwise negative elbow radiographs. Electronically Signed   By: Donavan Foil M.D.   On: 12/26/2016 18:55   Dg Wrist Complete Left  Result Date: 12/26/2016 CLINICAL DATA:  Fall 2 days ago.  Pain and swelling. EXAM: LEFT WRIST - COMPLETE 3+ VIEW COMPARISON:  No recent. FINDINGS: Fracture of the ulnar  styloid noted. Slight displacement of fracture fragments. Nondisplaced distal radial fracture cannot be excluded. Diffuse degenerative change. Diffuse osteopenia . IMPRESSION: Fracture of the ulnar styloid noted. Slightly displaced fracture fragments. Nondisplaced distal radial fracture cannot be excluded . Electronically Signed   By: Marcello Moores  Register   On: 12/26/2016 16:15   Dg Hand Complete Left  Result Date: 12/26/2016 CLINICAL DATA:  Fall 2 days ago.  Pain. EXAM: LEFT HAND - COMPLETE 3+ VIEW COMPARISON:  No recent prior. FINDINGS: Fracture of the ulnar styloid with slightly displaced fracture fragments noted. Nondisplaced fracture of the distal radius cannot be excluded. Diffuse degenerative change. Diffuse osteopenia . Diffuse degenerative change. IMPRESSION: 1. Fracture of the ulnar styloid with slightly displaced fracture fragments noted. Nondisplaced fracture of the distal radius cannot be excluded. 2. Diffuse degenerative change in osteopenia. Electronically Signed   By: Marcello Moores  Register   On: 12/26/2016 16:17    Procedures Procedures (including critical care time)  SPLINT APPLICATION Date/Time: 6:22 PM Authorized by: Varney Biles Consent: Verbal consent obtained. Risks and benefits: risks, benefits and alternatives were discussed Consent given by: patient Splint applied by: Orthopedic technician Location details: left wrist Splint type: sugar tong splint Supplies used: webril, ace bandage, plaster,  sling Post-procedure: The splinted body part was neurovascularly unchanged following the procedure. Patient tolerance: Patient tolerated the procedure well with no immediate complications.     Medications Ordered in ED Medications  acetaminophen (TYLENOL) tablet 650 mg (not administered)     Initial Impression / Assessment and Plan / ED Course  I have reviewed the triage vital signs and the nursing notes.  Pertinent labs & imaging results that were available during my care of the patient were reviewed by me and considered in my medical decision making (see chart for details).     Pt has a distal ulnar styloid process, possibly wrist fracture as well based on exam.  Hand surgery f/u advised. Sugar tong placed. Pt has elbow tenderness, could have distal humerus fracture - unfortunately, the splint was already in place when I went to reassess - so I will defer her care for possible distal humeral fracture to hand surgery. Pt is neurovascularly intact. Fall was 2 days ago and she has no red flags for TBI.  Strict ER return precautions have been discussed, and patient is agreeing with the plan and is comfortable with the workup done and the recommendations from the ER.   Final Clinical Impressions(s) / ED Diagnoses   Final diagnoses:  Closed fracture of distal end of left ulna, unspecified fracture morphology, initial encounter    New Prescriptions New Prescriptions   IBUPROFEN (ADVIL,MOTRIN) 400 MG TABLET    Take 1 tablet (400 mg total) by mouth every 12 (twelve) hours as needed.     Varney Biles, MD 12/26/16 [id number]    Varney Biles, MD 12/26/16 2020

## 2016-12-26 NOTE — Progress Notes (Signed)
Orthopedic Tech Progress Note Patient Details:  Alicia Roth 1931/03/16 [id number]  Ortho Devices Type of Ortho Device: Sugartong splint, Arm sling Ortho Device/Splint Location: left  Ortho Device/Splint Interventions: Application, Adjustment   Kristopher Oppenheim 12/26/2016, 7:03 PM

## 2018-02-16 ENCOUNTER — Encounter (HOSPITAL_COMMUNITY): Payer: Self-pay

## 2018-02-16 ENCOUNTER — Emergency Department (HOSPITAL_COMMUNITY): Payer: Medicare Other

## 2018-02-16 ENCOUNTER — Other Ambulatory Visit: Payer: Self-pay

## 2018-02-16 ENCOUNTER — Emergency Department (HOSPITAL_COMMUNITY)
Admission: EM | Admit: 2018-02-16 | Discharge: 2018-02-16 | Disposition: A | Discharge status: Home / Self Care | Payer: Medicare Other | Attending: Emergency Medicine | Admitting: Emergency Medicine

## 2018-02-16 DIAGNOSIS — W010XXA Fall on same level from slipping, tripping and stumbling without subsequent striking against object, initial encounter: Secondary | ICD-10-CM | POA: Insufficient documentation

## 2018-02-16 DIAGNOSIS — Y93H2 Activity, gardening and landscaping: Secondary | ICD-10-CM | POA: Diagnosis not present

## 2018-02-16 DIAGNOSIS — Y999 Unspecified external cause status: Secondary | ICD-10-CM | POA: Insufficient documentation

## 2018-02-16 DIAGNOSIS — F0390 Unspecified dementia without behavioral disturbance: Secondary | ICD-10-CM | POA: Diagnosis not present

## 2018-02-16 DIAGNOSIS — R51 Headache: Secondary | ICD-10-CM | POA: Diagnosis not present

## 2018-02-16 DIAGNOSIS — S42215A Unspecified nondisplaced fracture of surgical neck of left humerus, initial encounter for closed fracture: Secondary | ICD-10-CM | POA: Insufficient documentation

## 2018-02-16 DIAGNOSIS — Z7982 Long term (current) use of aspirin: Secondary | ICD-10-CM | POA: Insufficient documentation

## 2018-02-16 DIAGNOSIS — Z79899 Other long term (current) drug therapy: Secondary | ICD-10-CM | POA: Insufficient documentation

## 2018-02-16 DIAGNOSIS — I10 Essential (primary) hypertension: Secondary | ICD-10-CM | POA: Diagnosis not present

## 2018-02-16 DIAGNOSIS — S4992XA Unspecified injury of left shoulder and upper arm, initial encounter: Secondary | ICD-10-CM | POA: Diagnosis present

## 2018-02-16 DIAGNOSIS — Z85828 Personal history of other malignant neoplasm of skin: Secondary | ICD-10-CM | POA: Insufficient documentation

## 2018-02-16 DIAGNOSIS — W19XXXA Unspecified fall, initial encounter: Secondary | ICD-10-CM

## 2018-02-16 DIAGNOSIS — Y92007 Garden or yard of unspecified non-institutional (private) residence as the place of occurrence of the external cause: Secondary | ICD-10-CM | POA: Diagnosis not present

## 2018-02-16 DIAGNOSIS — S42302A Unspecified fracture of shaft of humerus, left arm, initial encounter for closed fracture: Secondary | ICD-10-CM

## 2018-02-16 MED ORDER — ACETAMINOPHEN 325 MG PO TABS: 650.0000 mg | ORAL_TABLET | Freq: Once | ORAL | Status: DC

## 2018-02-16 NOTE — ED Provider Notes (Signed)
Alicia Roth Provider Note   CSN: [id number] Arrival date & time: 02/16/18  1236     History   Chief Complaint Chief Complaint  Patient presents with  . Fall  . Arm Injury    Left arm pain post fall    HPI Alicia Roth is a 84 y.o. female with a past medical history of dementia, chronic back pain, arthritis, chronic pain, fibromyalgia, hypertension who presents to ED for fall that occurred prior to arrival.  Majority of history is provided by daughter at bedside.  She states that she was out in her yard raking leaves when she lost her balance and fell back.  Patient is complaining of left arm pain as well as diffuse back pain.  She was helped up by passerby's.  She denies any anticoagulant use.  Patient resides at home by herself.  Daughter states that she is "hateful and argumentative at baseline."  Denies any changes from baseline until status currently.  HPI  Past Medical History:  Diagnosis Date  . Anxiety   . Arthritis    "all over" (11/12/2016)  . Chronic back pain    "all over" (11/12/2016)  . Chronic pain syndrome   . Colitis   . Dementia ([place])    "never been dx'd but it's obvious" (11/12/2016)  . Depression   . Esophageal stricture   . Fibromyalgia   . Frequent UTI   . Hypertension   . Osteoporosis   . Pneumonia X 1  . Skin cancer    "back, LLE"    Patient Active Problem List   Diagnosis Date Noted  . Osteoporosis   . Hypertension   . Depression   . AKI (acute kidney injury) ([place])   . Dehydration   . Lower urinary tract infectious disease   . Sepsis due to urinary tract infection ([place]) 11/08/2016    Past Surgical History:  Procedure Laterality Date  . ABDOMINAL HYSTERECTOMY    . APPENDECTOMY    . BACK SURGERY  X 3   "upper back discs; upper back fusions; lower back w/rods and cages"  . CARDIOVASCULAR STRESS TEST  11/20/2009   ef 76%, no ischemia  . CATARACT EXTRACTION W/ INTRAOCULAR LENS  IMPLANT, BILATERAL  Bilateral   . CHOLECYSTECTOMY    . CLOSED REDUCTION NASAL FRACTURE  11/2007   Alicia Roth 08/01/2010  . COLONOSCOPY    . ESOPHAGOGASTRODUODENOSCOPY (EGD) WITH ESOPHAGEAL DILATION  "several times"  . FRACTURE SURGERY    . HEMIARTHROPLASTY SHOULDER FRACTURE Right 03/12/2004   Alicia Roth 08/13/2010  . POSTERIOR LUMBAR FUSION  ?2003   "in [place]"  . SKIN CANCER EXCISION     "back, LLE"  . TUBAL LIGATION    . VEIN LIGATION AND STRIPPING       OB History   None      Home Medications    Prior to Admission medications   Medication Sig Start Date End Date Taking? Authorizing Provider  acetaminophen (TYLENOL) 500 MG tablet Take 1 tablet (500 mg total) by mouth every 6 (six) hours as needed for mild pain. 12/26/16   Varney Biles, MD  aspirin 325 MG tablet Take 325 mg by mouth daily.    [provider]  donepezil (ARICEPT) 10 MG tablet Take 10 mg by mouth at bedtime.  09/03/16   [provider]  guaifenesin (HUMIBID E) 400 MG TABS tablet Take 400 mg by mouth every 4 (four) hours.    [provider]  ibuprofen (ADVIL,MOTRIN)  400 MG tablet Take 1 tablet (400 mg total) by mouth every 12 (twelve) hours as needed. 12/26/16   Varney Biles, MD  lisinopril (PRINIVIL,ZESTRIL) 20 MG tablet Take 20 mg by mouth daily.    [provider]  naproxen sodium (ANAPROX) 220 MG tablet Take 220 mg by mouth 2 (two) times daily with a meal.    [provider]  OVER THE COUNTER MEDICATION Take 1 tablet by mouth daily. Over the counter vitamin for bones    [provider]  PARoxetine (PAXIL) 20 MG tablet Take 10 mg by mouth daily.    [provider]  phenazopyridine (URISTAT) 95 MG tablet Take 95 mg by mouth 3 (three) times daily as needed for pain.    [provider]  sodium chloride (OCEAN) 0.65 % SOLN nasal spray Place 1 spray into both nostrils as needed for congestion.    [provider]  traMADol (ULTRAM) 50 MG tablet Take 1 tablet (50 mg  total) by mouth every 12 (twelve) hours as needed for moderate pain. 11/11/16   Alicia Ford, DO    Family History Family History  Problem Relation Age of Onset  . CAD Father   . Heart failure Sister   . Rheumatic fever Brother     Social History Social History   Tobacco Use  . Smoking status: Never Smoker  . Smokeless tobacco: Never Used  Substance Use Topics  . Alcohol use: No  . Drug use: No     Allergies   Codeine; Diazepam; Gabapentin; and Indomethacin   Review of Systems Review of Systems  Unable to perform ROS: Dementia  Musculoskeletal: Positive for arthralgias.     Physical Exam Updated Vital Signs BP 122/86 (BP Location: Left Arm)   Pulse 66   Temp 98.4 F (36.9 C) (Oral)   Resp (!) 22   Ht 5' (1.524 m)   Wt 68 kg   SpO2 100%   BMI 29.28 kg/m   Physical Exam  Constitutional: She appears well-developed and well-nourished. No distress.  Able to stand without difficulty.  HENT:  Head: Normocephalic and atraumatic.  Nose: Nose normal.  Eyes: Pupils are equal, round, and reactive to light. Conjunctivae and EOM are normal. Right eye exhibits discharge. Left eye exhibits no discharge. No scleral icterus.  Neck: Normal range of motion. Neck supple.  Cardiovascular: Normal rate, regular rhythm, normal heart sounds and intact distal pulses. Exam reveals no gallop and no friction rub.  No murmur heard. Pulmonary/Chest: Effort normal and breath sounds normal. No respiratory distress.  Abdominal: Soft. Bowel sounds are normal. She exhibits no distension. There is no tenderness. There is no guarding.  Musculoskeletal: Normal range of motion. She exhibits tenderness (Diffusely of C, T-spine at the midline paraspinal musculature). She exhibits no edema.  Tenderness to palpation of left shoulder.  Unable to perform range of motion secondary to pain.  No deformity noted.  2+ DP and 2+ radial pulses palpated bilaterally.  Full active and passive range of motion  of bilateral lower extremities at the hip.  Neurological: She is alert. She exhibits normal muscle tone. Coordination normal.  Alert, oriented to self, family member at bedside.  Unable to tell me her birthday, current events.  Able to answer questions although will ramble on about other things as well.  No gross facial asymmetry noted.  Skin: Skin is warm and dry. No rash noted.  Psychiatric: She has a normal mood and affect.  Nursing note and vitals reviewed.  ED Treatments / Results  Labs (all labs ordered are listed, but only abnormal results are displayed) Labs Reviewed - No data to display  EKG EKG Interpretation  Date/Time:  Tuesday February 16 2018 12:54:04 EST Ventricular Rate:  77 PR Interval:    QRS Duration: 77 QT Interval:  376 QTC Calculation: 426 R Axis:   46 Text Interpretation:  Sinus rhythm Baseline wander in lead(s) V1 No significant change since last tracing Confirmed by Deno Etienne [phone]) on 02/16/2018 1:00:58 PM Also confirmed by Deno Etienne [phone]), editor Shon Hale [phone])  on 02/16/2018 1:09:34 PM   Radiology Dg Thoracic Spine 2 View  Result Date: 02/16/2018 CLINICAL DATA:  Patient found in yard today after fall with severe mid back and left shoulder pain. EXAM: THORACIC SPINE 2 VIEWS COMPARISON:  11/10/2016 FINDINGS: Spinal fusion rods and pedicle screws span T9 through L3 without hardware failure or fracture. Redemonstration of mild superior endplate compression of T8 without retropulsion and moderate biconcave compression of T12. No new fracture is identified. The bones are demineralized. Thoracic spondylosis with multilevel degenerative disc disease is seen. No paraspinal soft tissue swelling or hematoma. Aortic atherosclerosis with uncoiling is noted of the thoracic aorta. Surgical clips in the right upper quadrant are noted. IMPRESSION: 1. Spinal fusion from T9 through L3 without hardware failure or fracture. 2. Redemonstration of mild superior  endplate compression of T8 and moderate biconcave compression of T12. No new fracture or suspicious osseous lesions. 3. Thoracic spondylosis with multilevel degenerative disc disease. Electronically Signed   By: Ashley Royalty M.D.   On: 02/16/2018 14:28   Ct Head Wo Contrast  Result Date: 02/16/2018 CLINICAL DATA:  Pain following fall EXAM: CT HEAD WITHOUT CONTRAST CT CERVICAL SPINE WITHOUT CONTRAST TECHNIQUE: Multidetector CT imaging of the head and cervical spine was performed following the standard protocol without intravenous contrast. Multiplanar CT image reconstructions of the cervical spine were also generated. COMPARISON:  CT head and CT cervical spine November 08, 2016 FINDINGS: CT HEAD FINDINGS Brain: There is moderate diffuse atrophy. There is no intracranial mass, hemorrhage, extra-axial fluid collection, or midline shift. There is patchy small vessel disease in the centra semiovale bilaterally. No acute infarct is evident on this study. Vascular: There is no appreciable hyperdense vessel. There is calcification in the distal vertebral arteries and in each carotid siphon region. Skull: The bony calvarium appears intact. Sinuses/Orbits: There is a small retention cyst in the anterior, medial right maxillary antrum. There is mucosal thickening in several ethmoid air cells with opacification in anterior left ethmoid air cell. Orbits appear symmetric bilaterally with evidence of prior cataract removals bilaterally. Other: Mastoid air cells are clear. CT CERVICAL SPINE FINDINGS Alignment: There is 2 mm anterolisthesis of T1 on T2. No cervical spondylolisthesis is evident. Skull base and vertebrae: Bones are osteoporotic. Skull base and craniocervical junction regions appear normal. No fracture is demonstrable. There are no blastic or lytic bone lesions. Soft tissues and spinal canal: Prevertebral soft tissues and predental space regions are normal. There is no appreciable paraspinous lesion. There is no  evident cord or canal hematoma. Disc levels: There is moderately severe disc space narrowing at C4-5, C5-6, C6-7, and C7-T1. There is milder disc space narrowing at C3-4. There are anterior osteophytes at C4, C5, C6, and C7. There is facet hypertrophy with exit foraminal narrowing at multiple levels. There is exit foraminal narrowing due to bony hypertrophy at multiple levels, most severe at C3-4 bilaterally. There is no frank disc extrusion or high-grade  stenosis. Upper chest: Visualized upper lung regions are clear. Other: There is bilateral carotid artery calcification. IMPRESSION: CT head: Atrophy with periventricular small vessel disease. No acute infarct. No mass or hemorrhage. There are foci of arterial vascular calcification. There are foci paranasal sinus disease. CT cervical spine: No fracture. No spondylolisthesis in the cervical region. Mild anterolisthesis of T1 on T2 is felt to be due to underlying spondylosis. There is multilevel spondylosis and osteoarthritis. No frank disc extrusion or high-grade stenosis. There is calcification in each carotid artery. Electronically Signed   By: Lowella Grip III M.D.   On: 02/16/2018 14:36   Ct Cervical Spine Wo Contrast  Result Date: 02/16/2018 CLINICAL DATA:  Pain following fall EXAM: CT HEAD WITHOUT CONTRAST CT CERVICAL SPINE WITHOUT CONTRAST TECHNIQUE: Multidetector CT imaging of the head and cervical spine was performed following the standard protocol without intravenous contrast. Multiplanar CT image reconstructions of the cervical spine were also generated. COMPARISON:  CT head and CT cervical spine November 08, 2016 FINDINGS: CT HEAD FINDINGS Brain: There is moderate diffuse atrophy. There is no intracranial mass, hemorrhage, extra-axial fluid collection, or midline shift. There is patchy small vessel disease in the centra semiovale bilaterally. No acute infarct is evident on this study. Vascular: There is no appreciable hyperdense vessel. There is  calcification in the distal vertebral arteries and in each carotid siphon region. Skull: The bony calvarium appears intact. Sinuses/Orbits: There is a small retention cyst in the anterior, medial right maxillary antrum. There is mucosal thickening in several ethmoid air cells with opacification in anterior left ethmoid air cell. Orbits appear symmetric bilaterally with evidence of prior cataract removals bilaterally. Other: Mastoid air cells are clear. CT CERVICAL SPINE FINDINGS Alignment: There is 2 mm anterolisthesis of T1 on T2. No cervical spondylolisthesis is evident. Skull base and vertebrae: Bones are osteoporotic. Skull base and craniocervical junction regions appear normal. No fracture is demonstrable. There are no blastic or lytic bone lesions. Soft tissues and spinal canal: Prevertebral soft tissues and predental space regions are normal. There is no appreciable paraspinous lesion. There is no evident cord or canal hematoma. Disc levels: There is moderately severe disc space narrowing at C4-5, C5-6, C6-7, and C7-T1. There is milder disc space narrowing at C3-4. There are anterior osteophytes at C4, C5, C6, and C7. There is facet hypertrophy with exit foraminal narrowing at multiple levels. There is exit foraminal narrowing due to bony hypertrophy at multiple levels, most severe at C3-4 bilaterally. There is no frank disc extrusion or high-grade stenosis. Upper chest: Visualized upper lung regions are clear. Other: There is bilateral carotid artery calcification. IMPRESSION: CT head: Atrophy with periventricular small vessel disease. No acute infarct. No mass or hemorrhage. There are foci of arterial vascular calcification. There are foci paranasal sinus disease. CT cervical spine: No fracture. No spondylolisthesis in the cervical region. Mild anterolisthesis of T1 on T2 is felt to be due to underlying spondylosis. There is multilevel spondylosis and osteoarthritis. No frank disc extrusion or high-grade  stenosis. There is calcification in each carotid artery. Electronically Signed   By: Lowella Grip III M.D.   On: 02/16/2018 14:36   Dg Shoulder Left  Result Date: 02/16/2018 CLINICAL DATA:  Left shoulder pain after fall. EXAM: LEFT SHOULDER - 2+ VIEW COMPARISON:  None. FINDINGS: The bones are demineralized about left shoulder limiting assessment for acute fractures. Remote appearing surgical neck impaction fracture with healing is suggested about the humeral head-neck junction with some callus formation on the scapular  view noted. Marked osteoarthritic joint space narrowing and subchondral sclerosis with spurring is noted about the glenohumeral joint with moderate osteoarthritis of the left AC joint. Old remote left fifth lateral rib fracture. Atherosclerosis of the thoracic aorta. Partially imaged thoracic spinal fusion hardware without complicating features. IMPRESSION: 1. Limited study due to osteopenic appearance of the bones. 2. There appears to be a remote surgical neck fracture with impaction in the background of acromioclavicular and glenohumeral joint osteoarthritis. If the patient has pain out of proportion to radiographic findings, CT may help identify radiographically occult fractures. Electronically Signed   By: Ashley Royalty M.D.   On: 02/16/2018 14:38    Procedures Procedures (including critical care time)  Medications Ordered in ED Medications  acetaminophen (TYLENOL) tablet 650 mg (has no administration in time range)     Initial Impression / Assessment and Plan / ED Course  I have reviewed the triage vital signs and the nursing notes.  Pertinent labs & imaging results that were available during my care of the patient were reviewed by me and considered in my medical decision making (see chart for details).     84 year old female with a past medical history of dementia, chronic pain, arthritis, fibromyalgia, hypertension presents to ED for fall that occurred prior to  arrival.  Majority of history is provided by daughter at bedside.  States that she has had similar falls in her front yard while raking leaves or doing other work over the past several months.  She fell today after losing her balance.  She was helped by neighbors.  Daughter states that she is at her baseline mental status secondary to her dementia.  She complains of left arm pain.  Daughter does note a history of a fracture in this arm in the past from a fall.  On exam patient is alert, able to answer questions although still rambling.  Full active and passive range of motion of bilateral lower extremities at the hip.  Unable to perform range of motion secondary to pain of the left extremity although she does use a one moving.  Areas are neurovascularly intact.  No facial asymmetry noted.  CT of the head and neck are unremarkable.  Thoracic spine x-ray is negative for hardware bowel function.  X-ray of the left shoulder shows remote surgical neck fracture.  I do not feel that CT of the area is necessary.  Will place patient in sling, give orthopedic follow-up and advised her to take Tylenol as needed.  Patient is agreeable to this plan. Patient discussed with and seen by my attending, Dr. Tyrone Nine.  Patient is hemodynamically stable, in NAD, and able to ambulate in the ED. Evaluation does not show pathology that would require ongoing emergent intervention or inpatient treatment. I explained the diagnosis to the patient. Pain has been managed and has no complaints prior to discharge. Patient is comfortable with above plan and is stable for discharge at this time. All questions were answered prior to disposition. Strict return precautions for returning to the ED were discussed. Encouraged follow up with PCP.    Portions of this note were generated with [employer]. Dictation errors may occur despite best attempts at proofreading.  Final Clinical Impressions(s) / ED Diagnoses   Final diagnoses:    Fall, initial encounter  Closed fracture of left upper extremity, initial encounter    ED Discharge Orders    None       Delia Heady, PA-C 02/16/18 [place],  Linna Hoff, DO 02/17/18 [phone]

## 2018-02-16 NOTE — ED Triage Notes (Signed)
Pt from home alone.  Was in the yard per pt report raking leaves and was seen b y a passing car falling in the yard. Pt now guarding left arm and shoulder with grimacing pain. Baseline dementia and generalized pain per family member at bedside due to multiple falls over the past few years. No obvious deformities or injuries seen on exam.

## 2018-02-16 NOTE — Discharge Instructions (Signed)
Take Tylenol as needed for pain.  Follow the instructions regarding shoulder range of motion exercises to prevent stiffness and worsening pain. Wear the sling as directed.  You will need to follow-up with the orthopedic doctor listed below for further evaluation of your possible injury. Return to ED for worsening symptoms, injuries or falls, leg or arm swelling, chest pain, shortness of breath.

## 2018-02-24 ENCOUNTER — Ambulatory Visit (INDEPENDENT_AMBULATORY_CARE_PROVIDER_SITE_OTHER): Payer: Medicare Other | Admitting: Orthopaedic Surgery

## 2018-02-24 ENCOUNTER — Encounter (INDEPENDENT_AMBULATORY_CARE_PROVIDER_SITE_OTHER): Payer: Self-pay | Admitting: Orthopaedic Surgery

## 2018-02-24 DIAGNOSIS — S42295A Other nondisplaced fracture of upper end of left humerus, initial encounter for closed fracture: Secondary | ICD-10-CM

## 2018-02-24 NOTE — Progress Notes (Signed)
Office Visit Note   Patient: Alicia Roth           Date of Birth: Dec 19, 1930           MRN: [id number] Visit Date: 02/24/2018              Requested by: Leonard Downing, MD [place], [place] [address] PCP: Leonard Downing, MD   Assessment & Plan: Visit Diagnoses:  1. Other closed nondisplaced fracture of proximal end of left humerus, initial encounter     Plan: Impression is a nondisplaced surgical neck fracture.  She has baseline osteoarthritis of the glenohumeral joint.  This will certainly make her function and range of motion worse.  We will attempt to place her in a sling hopefully she is compliant with this.  Nonweightbearing.  Recheck in 2 weeks with AP and scapular Y of her left shoulder.  Follow-Up Instructions: Return in about 2 weeks (around 03/10/2018).   Orders:  No orders of the defined types were placed in this encounter.  No orders of the defined types were placed in this encounter.     Procedures: No procedures performed   Clinical Data: No additional findings.   Subjective: Chief Complaint  Patient presents with  . Left Shoulder - Fracture    Alicia Roth is a 84 year old female with advanced dementia who comes in with an acute injury to her left shoulder status post mechanical fall approximately 1 week ago.  She presents today with her daughter.  She is endorsing left shoulder pain and bruising.  She is not taking pain medicines and is not compliant with sling use.  She lives at home with her daughter.  Other details of the HPI are limited secondary to dementia.   Review of Systems  Constitutional: Negative.   HENT: Negative.   Eyes: Negative.   Respiratory: Negative.   Cardiovascular: Negative.   Endocrine: Negative.   Musculoskeletal: Negative.   Neurological: Negative.   Hematological: Negative.   Psychiatric/Behavioral: Negative.   All other systems reviewed and are negative.    Objective: Vital Signs: There  were no vitals taken for this visit.  Physical Exam  Constitutional: She is oriented to person, place, and time. She appears well-developed and well-nourished.  HENT:  Head: Normocephalic and atraumatic.  Eyes: EOM are normal.  Neck: Neck supple.  Pulmonary/Chest: Effort normal.  Abdominal: Soft.  Neurological: She is alert and oriented to person, place, and time.  Skin: Skin is warm. Capillary refill takes less than 2 seconds.  Psychiatric: She has a normal mood and affect. Her behavior is normal. Judgment and thought content normal.  Nursing note and vitals reviewed.   Ortho Exam Left shoulder exam shows painful attempted range of motion.  There is crepitus and catching with range of motion.  Her upper arm is slightly bruised and swollen.  There is no neurovascular compromise. Specialty Comments:  No specialty comments available.  Imaging: No results found.   PMFS History: Patient Active Problem List   Diagnosis Date Noted  . Osteoporosis   . Hypertension   . Depression   . AKI (acute kidney injury) ([place])   . Dehydration   . Lower urinary tract infectious disease   . Sepsis due to urinary tract infection ([place]) 11/08/2016   Past Medical History:  Diagnosis Date  . Anxiety   . Arthritis    "all over" (11/12/2016)  . Chronic back pain    "all over" (11/12/2016)  . Chronic pain  syndrome   . Colitis   . Dementia ([place])    "never been dx'd but it's obvious" (11/12/2016)  . Depression   . Esophageal stricture   . Fibromyalgia   . Frequent UTI   . Hypertension   . Osteoporosis   . Pneumonia X 1  . Skin cancer    "back, LLE"    Family History  Problem Relation Age of Onset  . CAD Father   . Heart failure Sister   . Rheumatic fever Brother     Past Surgical History:  Procedure Laterality Date  . ABDOMINAL HYSTERECTOMY    . APPENDECTOMY    . BACK SURGERY  X 3   "upper back discs; upper back fusions; lower back w/rods and cages"  . CARDIOVASCULAR STRESS TEST   11/20/2009   ef 76%, no ischemia  . CATARACT EXTRACTION W/ INTRAOCULAR LENS  IMPLANT, BILATERAL Bilateral   . CHOLECYSTECTOMY    . CLOSED REDUCTION NASAL FRACTURE  11/2007   [name] 08/01/2010  . COLONOSCOPY    . ESOPHAGOGASTRODUODENOSCOPY (EGD) WITH ESOPHAGEAL DILATION  "several times"  . FRACTURE SURGERY    . HEMIARTHROPLASTY SHOULDER FRACTURE Right 03/12/2004   [name] 08/13/2010  . POSTERIOR LUMBAR FUSION  ?2003   "in [place]"  . SKIN CANCER EXCISION     "back, LLE"  . TUBAL LIGATION    . VEIN LIGATION AND STRIPPING     Social History   Occupational History  . Not on file  Tobacco Use  . Smoking status: Never Smoker  . Smokeless tobacco: Never Used  Substance and Sexual Activity  . Alcohol use: No  . Drug use: No  . Sexual activity: Not on file

## 2018-03-11 ENCOUNTER — Ambulatory Visit (INDEPENDENT_AMBULATORY_CARE_PROVIDER_SITE_OTHER): Payer: Medicare Other | Admitting: Orthopaedic Surgery

## 2018-05-17 ENCOUNTER — Emergency Department (HOSPITAL_COMMUNITY): Payer: Medicare Other

## 2018-05-17 ENCOUNTER — Emergency Department (HOSPITAL_COMMUNITY)
Admission: EM | Admit: 2018-05-17 | Discharge: 2018-05-17 | Disposition: A | Discharge status: Home / Self Care | Payer: Medicare Other | Attending: Emergency Medicine | Admitting: Emergency Medicine

## 2018-05-17 ENCOUNTER — Encounter (HOSPITAL_COMMUNITY): Payer: Self-pay | Admitting: Emergency Medicine

## 2018-05-17 ENCOUNTER — Other Ambulatory Visit: Payer: Self-pay

## 2018-05-17 DIAGNOSIS — Z79899 Other long term (current) drug therapy: Secondary | ICD-10-CM | POA: Diagnosis not present

## 2018-05-17 DIAGNOSIS — Z7982 Long term (current) use of aspirin: Secondary | ICD-10-CM | POA: Diagnosis not present

## 2018-05-17 DIAGNOSIS — N390 Urinary tract infection, site not specified: Secondary | ICD-10-CM | POA: Diagnosis not present

## 2018-05-17 DIAGNOSIS — I10 Essential (primary) hypertension: Secondary | ICD-10-CM | POA: Diagnosis not present

## 2018-05-17 DIAGNOSIS — W19XXXA Unspecified fall, initial encounter: Secondary | ICD-10-CM

## 2018-05-17 DIAGNOSIS — Z96611 Presence of right artificial shoulder joint: Secondary | ICD-10-CM | POA: Insufficient documentation

## 2018-05-17 DIAGNOSIS — F0390 Unspecified dementia without behavioral disturbance: Secondary | ICD-10-CM | POA: Diagnosis not present

## 2018-05-17 DIAGNOSIS — Z043 Encounter for examination and observation following other accident: Secondary | ICD-10-CM | POA: Diagnosis present

## 2018-05-17 LAB — CBC WITH DIFFERENTIAL/PLATELET
ABS IMMATURE GRANULOCYTES: 0.02 10*3/uL (ref 0.00–0.07)
BASOS ABS: 0 10*3/uL (ref 0.0–0.1)
Basophils Relative: 0 %
Eosinophils Absolute: 0.1 10*3/uL (ref 0.0–0.5)
Eosinophils Relative: 1 %
HCT: 34.3 % — ABNORMAL LOW (ref 36.0–46.0)
Hemoglobin: 10.6 g/dL — ABNORMAL LOW (ref 12.0–15.0)
IMMATURE GRANULOCYTES: 0 %
LYMPHS PCT: 8 %
Lymphs Abs: 0.8 10*3/uL (ref 0.7–4.0)
MCH: 29.3 pg (ref 26.0–34.0)
MCHC: 30.9 g/dL (ref 30.0–36.0)
MCV: 94.8 fL (ref 80.0–100.0)
Monocytes Absolute: 0.8 10*3/uL (ref 0.1–1.0)
Monocytes Relative: 8 %
NEUTROS ABS: 8.2 10*3/uL — AB (ref 1.7–7.7)
NEUTROS PCT: 83 %
NRBC: 0 % (ref 0.0–0.2)
PLATELETS: 207 10*3/uL (ref 150–400)
RBC: 3.62 MIL/uL — AB (ref 3.87–5.11)
RDW: 13.5 % (ref 11.5–15.5)
WBC: 10 10*3/uL (ref 4.0–10.5)

## 2018-05-17 LAB — URINALYSIS, ROUTINE W REFLEX MICROSCOPIC
Bilirubin Urine: NEGATIVE
Glucose, UA: NEGATIVE mg/dL
Ketones, ur: NEGATIVE mg/dL
NITRITE: POSITIVE — AB
PH: 8 (ref 5.0–8.0)
Protein, ur: NEGATIVE mg/dL
SPECIFIC GRAVITY, URINE: 1.012 (ref 1.005–1.030)
WBC, UA: >50 WBC/hpf — ABNORMAL HIGH (ref 0–5)

## 2018-05-17 LAB — BASIC METABOLIC PANEL
Anion gap: 9 (ref 5–15)
BUN: 22 mg/dL (ref 8–23)
CO2: 24 mmol/L (ref 22–32)
CREATININE: 0.79 mg/dL (ref 0.44–1.00)
Calcium: 9.2 mg/dL (ref 8.9–10.3)
Chloride: 108 mmol/L (ref 98–111)
Glucose, Bld: 117 mg/dL — ABNORMAL HIGH (ref 70–99)
POTASSIUM: 4 mmol/L (ref 3.5–5.1)
SODIUM: 141 mmol/L (ref 135–145)

## 2018-05-17 MED ORDER — SODIUM CHLORIDE 0.9 % IV SOLN
1.0000 g | Freq: Once | INTRAVENOUS | Status: AC
  Administered 2018-05-17: 1 g via INTRAVENOUS
  Filled 2018-05-17: qty 10

## 2018-05-17 MED ORDER — CEPHALEXIN 500 MG PO CAPS: 500.0000 mg | ORAL_CAPSULE | Freq: Four times a day (QID) | ORAL | 0 refills | Status: AC

## 2018-05-17 NOTE — ED Provider Notes (Signed)
[facility] EMERGENCY DEPARTMENT Provider Note   CSN: [id number] Arrival date & time: 05/17/18  0047     History   Chief Complaint Chief Complaint  Patient presents with  . Fall    HPI Alicia Roth is a 85 y.o. female.  Patient presents to the emergency department with a chief complaint of fall.  Per EMS, patient had an unwitnessed fall at home.  Hx of dementia.  Level 5 caveat applies secondary to dementia.  The history is provided by the EMS personnel. No language interpreter was used.    Past Medical History:  Diagnosis Date  . Anxiety   . Arthritis    "all over" (11/12/2016)  . Chronic back pain    "all over" (11/12/2016)  . Chronic pain syndrome   . Colitis   . Dementia ([place])    "never been dx'd but it's obvious" (11/12/2016)  . Depression   . Esophageal stricture   . Fibromyalgia   . Frequent UTI   . Hypertension   . Osteoporosis   . Pneumonia X 1  . Skin cancer    "back, LLE"    Patient Active Problem List   Diagnosis Date Noted  . Osteoporosis   . Hypertension   . Depression   . AKI (acute kidney injury) ([place])   . Dehydration   . Lower urinary tract infectious disease   . Sepsis due to urinary tract infection ([place]) 11/08/2016    Past Surgical History:  Procedure Laterality Date  . ABDOMINAL HYSTERECTOMY    . APPENDECTOMY    . BACK SURGERY  X 3   "upper back discs; upper back fusions; lower back w/rods and cages"  . CARDIOVASCULAR STRESS TEST  11/20/2009   ef 76%, no ischemia  . CATARACT EXTRACTION W/ INTRAOCULAR LENS  IMPLANT, BILATERAL Bilateral   . CHOLECYSTECTOMY    . CLOSED REDUCTION NASAL FRACTURE  11/2007   [name] 08/01/2010  . COLONOSCOPY    . ESOPHAGOGASTRODUODENOSCOPY (EGD) WITH ESOPHAGEAL DILATION  "several times"  . FRACTURE SURGERY    . HEMIARTHROPLASTY SHOULDER FRACTURE Right 03/12/2004   [name] 08/13/2010  . POSTERIOR LUMBAR FUSION  ?2003   "in [place]"  . SKIN CANCER EXCISION     "back, LLE"  . TUBAL  LIGATION    . VEIN LIGATION AND STRIPPING       OB History   No obstetric history on file.      Home Medications    Prior to Admission medications   Medication Sig Start Date End Date Taking? Authorizing Provider  acetaminophen (TYLENOL) 500 MG tablet Take 1 tablet (500 mg total) by mouth every 6 (six) hours as needed for mild pain. 12/26/16   Varney Biles, MD  aspirin 325 MG tablet Take 325 mg by mouth daily.    [provider]  donepezil (ARICEPT) 10 MG tablet Take 10 mg by mouth at bedtime.  09/03/16   [provider]  guaifenesin (HUMIBID E) 400 MG TABS tablet Take 400 mg by mouth every 4 (four) hours.    [provider]  ibuprofen (ADVIL,MOTRIN) 400 MG tablet Take 1 tablet (400 mg total) by mouth every 12 (twelve) hours as needed. 12/26/16   Varney Biles, MD  lisinopril (PRINIVIL,ZESTRIL) 20 MG tablet Take 20 mg by mouth daily.    [provider]  naproxen sodium (ANAPROX) 220 MG tablet Take 220 mg by mouth 2 (two) times daily with a meal.    [provider]  OVER THE  COUNTER MEDICATION Take 1 tablet by mouth daily. Over the counter vitamin for bones    [provider]  PARoxetine (PAXIL) 20 MG tablet Take 10 mg by mouth daily.    [provider]  phenazopyridine (URISTAT) 95 MG tablet Take 95 mg by mouth 3 (three) times daily as needed for pain.    [provider]  sodium chloride (OCEAN) 0.65 % SOLN nasal spray Place 1 spray into both nostrils as needed for congestion.    [provider]  traMADol (ULTRAM) 50 MG tablet Take 1 tablet (50 mg total) by mouth every 12 (twelve) hours as needed for moderate pain. 11/11/16   Cristal Ford, DO    Family History Family History  Problem Relation Age of Onset  . CAD Father   . Heart failure Sister   . Rheumatic fever Brother     Social History Social History   Tobacco Use  . Smoking status: Never Smoker  . Smokeless tobacco: Never Used    Substance Use Topics  . Alcohol use: No  . Drug use: No     Allergies   Codeine; Diazepam; Gabapentin; and Indomethacin   Review of Systems Review of Systems  Unable to perform ROS: Dementia     Physical Exam Updated Vital Signs BP (!) 151/98 (BP Location: Right Arm)   Pulse 86   Resp 16   SpO2 100%   Physical Exam Vitals signs and nursing note reviewed.  Constitutional:      Appearance: She is well-developed.     Comments: Incontinent of urine shivering  HENT:     Head: Normocephalic and atraumatic.  Eyes:     Conjunctiva/sclera: Conjunctivae normal.     Pupils: Pupils are equal, round, and reactive to light.  Neck:     Musculoskeletal: Normal range of motion and neck supple.  Cardiovascular:     Rate and Rhythm: Normal rate and regular rhythm.     Heart sounds: No murmur. No friction rub. No gallop.   Pulmonary:     Effort: Pulmonary effort is normal. No respiratory distress.     Breath sounds: Normal breath sounds. No wheezing or rales.  Chest:     Chest wall: No tenderness.  Abdominal:     General: Bowel sounds are normal. There is no distension.     Palpations: Abdomen is soft. There is no mass.     Tenderness: There is no abdominal tenderness. There is no guarding or rebound.  Musculoskeletal: Normal range of motion.        General: No tenderness.     Comments: No bony deformity or abnormality of the extremities, normal passive ROM without pain  Skin:    General: Skin is warm and dry.  Neurological:     Mental Status: She is alert and oriented to person, place, and time.  Psychiatric:        Behavior: Behavior normal.        Thought Content: Thought content normal.        Judgment: Judgment normal.      ED Treatments / Results  Labs (all labs ordered are listed, but only abnormal results are displayed) Labs Reviewed  URINALYSIS, ROUTINE W REFLEX MICROSCOPIC - Abnormal; Notable for the following components:      Result Value   APPearance  CLOUDY (*)    Hgb urine dipstick SMALL (*)    Nitrite POSITIVE (*)    Leukocytes,Ua LARGE (*)    WBC, UA >50 (*)  Bacteria, UA RARE (*)    All other components within normal limits  CBC WITH DIFFERENTIAL/PLATELET - Abnormal; Notable for the following components:   RBC 3.62 (*)    Hemoglobin 10.6 (*)    HCT 34.3 (*)    Neutro Abs 8.2 (*)    All other components within normal limits  BASIC METABOLIC PANEL - Abnormal; Notable for the following components:   Glucose, Bld 117 (*)    All other components within normal limits    EKG None  Radiology Ct Head Wo Contrast  Result Date: 05/17/2018 CLINICAL DATA:  85 year old female status post unwitnessed fall. EXAM: CT HEAD WITHOUT CONTRAST CT CERVICAL SPINE WITHOUT CONTRAST TECHNIQUE: Multidetector CT imaging of the head and cervical spine was performed following the standard protocol without intravenous contrast. Multiplanar CT image reconstructions of the cervical spine were also generated. COMPARISON:  02/16/2018 head and cervical spine CT. FINDINGS: CT HEAD FINDINGS Brain: Stable cerebral volume. No midline shift, ventriculomegaly, mass effect, evidence of mass lesion, intracranial hemorrhage or evidence of cortically based acute infarction. Stable and largely normal for age gray-white matter differentiation throughout the brain. Vascular: Calcified atherosclerosis at the skull base. Skull: Stable and intact. Sinuses/Orbits: Visualized paranasal sinuses and mastoids are stable and well pneumatized. Other: Finding no acute orbit or scalp. Soft tissue CT CERVICAL SPINE FINDINGS Alignment: Stable straightening of lordosis. Cervicothoracic junction alignment is within normal limits. Bilateral posterior element alignment is within normal limits. Skull base and vertebrae: Osteopenia. Visualized skull base is intact. No atlanto-occipital dissociation. No cervical spine fracture identified. Soft tissues and spinal canal: No prevertebral fluid or  swelling. No visible canal hematoma. Calcified carotid atherosclerosis. Otherwise negative noncontrast neck soft tissues. Disc levels: Chronic cervical spine degeneration appears stable with up to mild degenerative spinal stenosis. Upper chest: Stable mild degenerative appearing anterolisthesis of T1 on T2. Negative lung apices, noncontrast thoracic inlet IMPRESSION: 1. No acute traumatic injury identified in the head or cervical spine. 2. Stable non contrast CT appearance of the brain. Electronically Signed   By: Genevie Ann M.D.   On: 05/17/2018 02:52   Ct Cervical Spine Wo Contrast  Result Date: 05/17/2018 CLINICAL DATA:  85 year old female status post unwitnessed fall. EXAM: CT HEAD WITHOUT CONTRAST CT CERVICAL SPINE WITHOUT CONTRAST TECHNIQUE: Multidetector CT imaging of the head and cervical spine was performed following the standard protocol without intravenous contrast. Multiplanar CT image reconstructions of the cervical spine were also generated. COMPARISON:  02/16/2018 head and cervical spine CT. FINDINGS: CT HEAD FINDINGS Brain: Stable cerebral volume. No midline shift, ventriculomegaly, mass effect, evidence of mass lesion, intracranial hemorrhage or evidence of cortically based acute infarction. Stable and largely normal for age gray-white matter differentiation throughout the brain. Vascular: Calcified atherosclerosis at the skull base. Skull: Stable and intact. Sinuses/Orbits: Visualized paranasal sinuses and mastoids are stable and well pneumatized. Other: Finding no acute orbit or scalp. Soft tissue CT CERVICAL SPINE FINDINGS Alignment: Stable straightening of lordosis. Cervicothoracic junction alignment is within normal limits. Bilateral posterior element alignment is within normal limits. Skull base and vertebrae: Osteopenia. Visualized skull base is intact. No atlanto-occipital dissociation. No cervical spine fracture identified. Soft tissues and spinal canal: No prevertebral fluid or swelling.  No visible canal hematoma. Calcified carotid atherosclerosis. Otherwise negative noncontrast neck soft tissues. Disc levels: Chronic cervical spine degeneration appears stable with up to mild degenerative spinal stenosis. Upper chest: Stable mild degenerative appearing anterolisthesis of T1 on T2. Negative lung apices, noncontrast thoracic inlet IMPRESSION: 1. No acute traumatic  injury identified in the head or cervical spine. 2. Stable non contrast CT appearance of the brain. Electronically Signed   By: Genevie Ann M.D.   On: 05/17/2018 02:52    Procedures Procedures (including critical care time)  Medications Ordered in ED Medications - No data to display   Initial Impression / Assessment and Plan / ED Course  I have reviewed the triage vital signs and the nursing notes.  Pertinent labs & imaging results that were available during my care of the patient were reviewed by me and considered in my medical decision making (see chart for details).    Patient with unwitnessed fall at home.  She is demented.  Lives at home by herself.  Daughter reports that patient has been incontinent of urine for the past couple of days.  Will check labs and urinalysis.  CT imaging is negative for acute findings.  Patient moves all extremities.  She is in no acute distress.  Urinalysis is concerning for infection.  Will treat UTI.  Daughter would like to take the patient home.  They will watch after her.  Return precautions discussed.  Patient seen by and discussed with Dr. Kathrynn Humble.  Final Clinical Impressions(s) / ED Diagnoses   Final diagnoses:  Fall, initial encounter  Urinary tract infection without hematuria, site unspecified    ED Discharge Orders    None       Montine Circle, PA-C 05/17/18 [place], [place], MD 05/17/18 [phone]

## 2018-05-17 NOTE — ED Notes (Signed)
Reviewed d/c instructions with pt and family caregiver, who verbalized understanding and had no outstanding questions. Pt armband removed by staff and pt given clean paper scrubs. Pt departed in NAD, escorted to front lobby by Philinda, NT.

## 2018-05-17 NOTE — ED Triage Notes (Signed)
Pt BIB [facility] from home, unwitnessed fall, ?LOC. Hx dementia, denies pain.

## 2018-07-30 DEATH — deceased

## 2019-03-06 IMAGING — CR DG THORACIC SPINE 2V
2 series · 2 of 2 positions shown · non-contrast
Comparison: 11/10/2016

CLINICAL DATA: Patient found in yard today after fall with severe
mid back and left shoulder pain.

EXAM:
THORACIC SPINE 2 VIEWS

[t-spine ap]
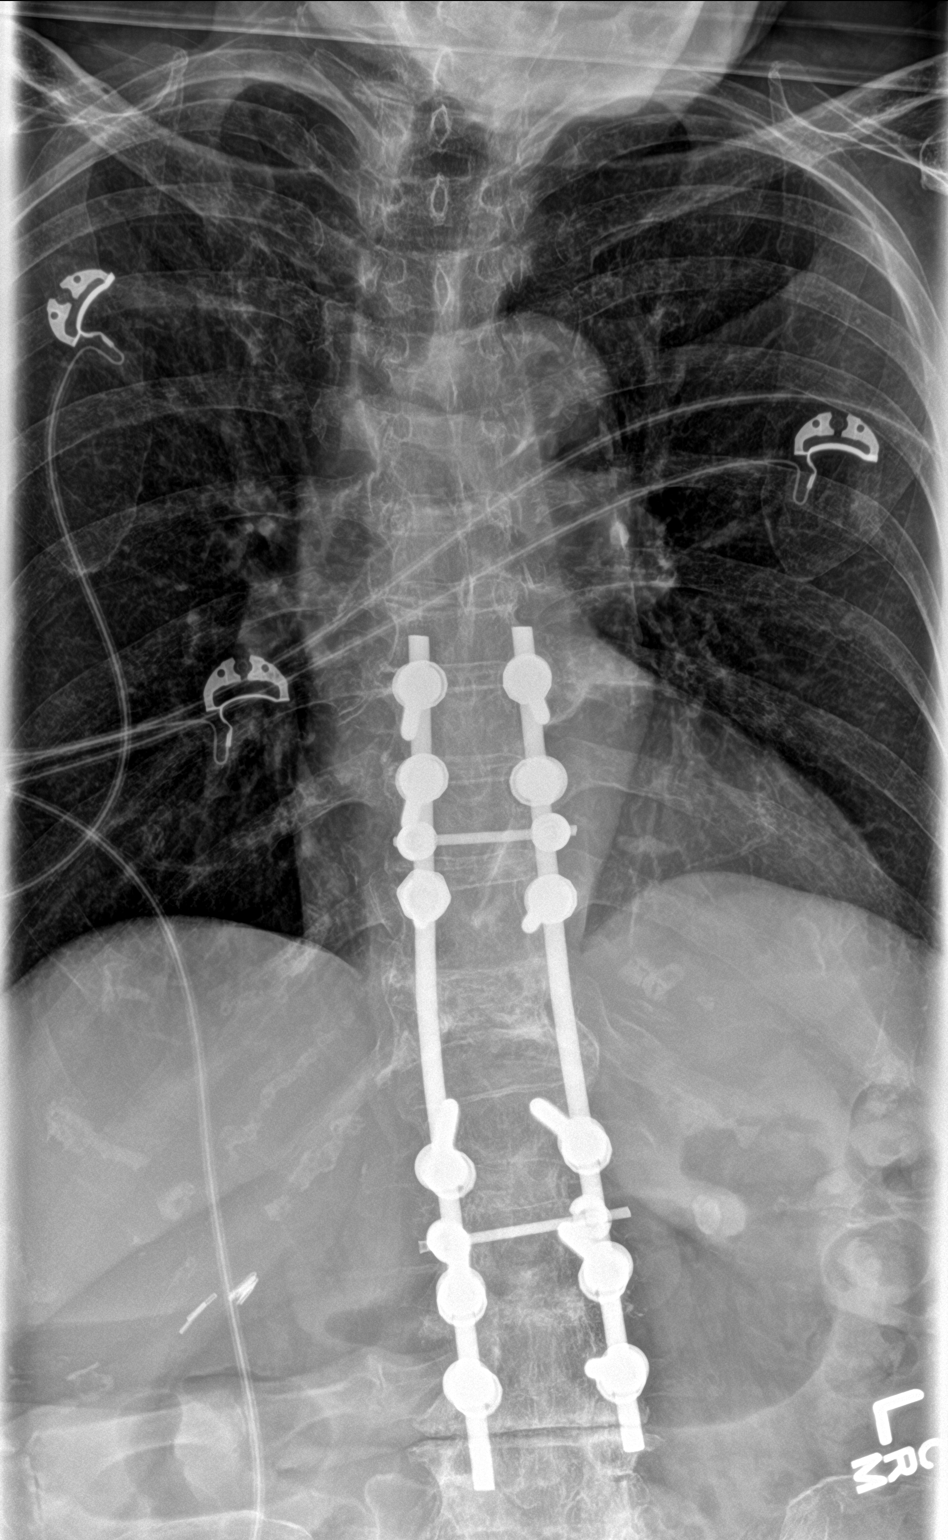

[t-spine lat]
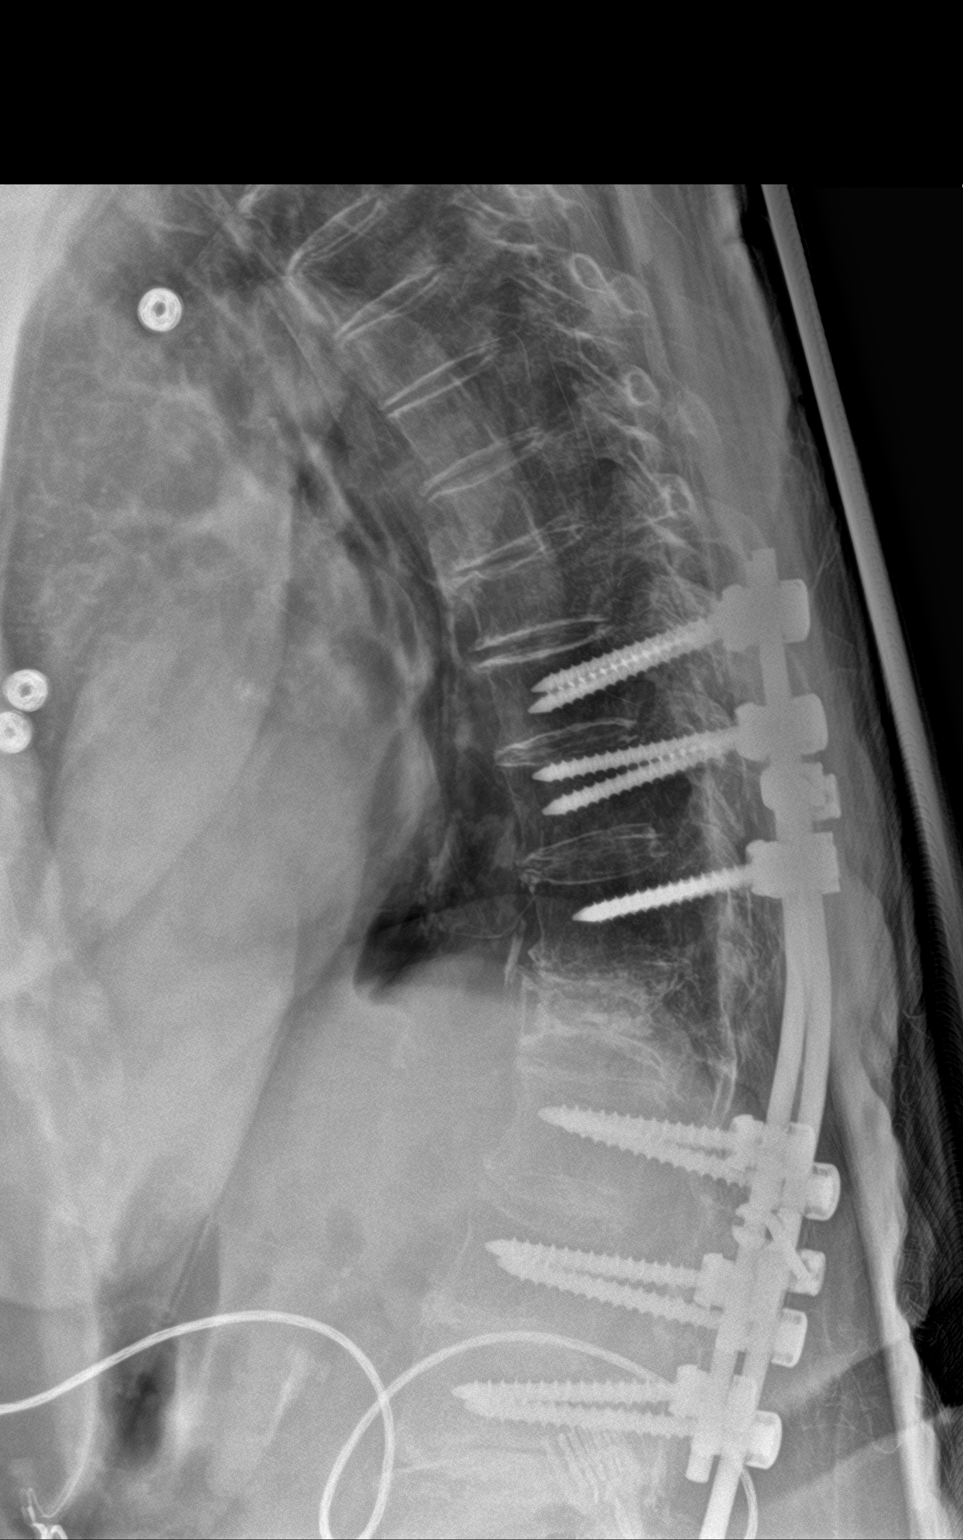

[2 of 2 positions shown; findings below may reference images not displayed]

FINDINGS: Spinal fusion rods and pedicle screws span T9 through L3 without
hardware failure or fracture. Redemonstration of mild superior
endplate compression of T8 without retropulsion and moderate
biconcave compression of T12. No new fracture is identified. The
bones are demineralized. Thoracic spondylosis with multilevel
degenerative disc disease is seen. No paraspinal soft tissue
swelling or hematoma. Aortic atherosclerosis with uncoiling is noted
of the thoracic aorta. Surgical clips in the right upper quadrant
are noted.
IMPRESSION: 1. Spinal fusion from T9 through L3 without hardware failure or
fracture.
2. Redemonstration of mild superior endplate compression of T8 and
moderate biconcave compression of T12. No new fracture or suspicious
osseous lesions.
3. Thoracic spondylosis with multilevel degenerative disc disease.

## 2019-03-06 IMAGING — CR DG SHOULDER 2+V*L*
2 series · 3 of 3 positions shown · non-contrast
Comparison: None.

CLINICAL DATA: Left shoulder pain after fall.

EXAM:
LEFT SHOULDER - 2+ VIEW

[shoulder grashey]
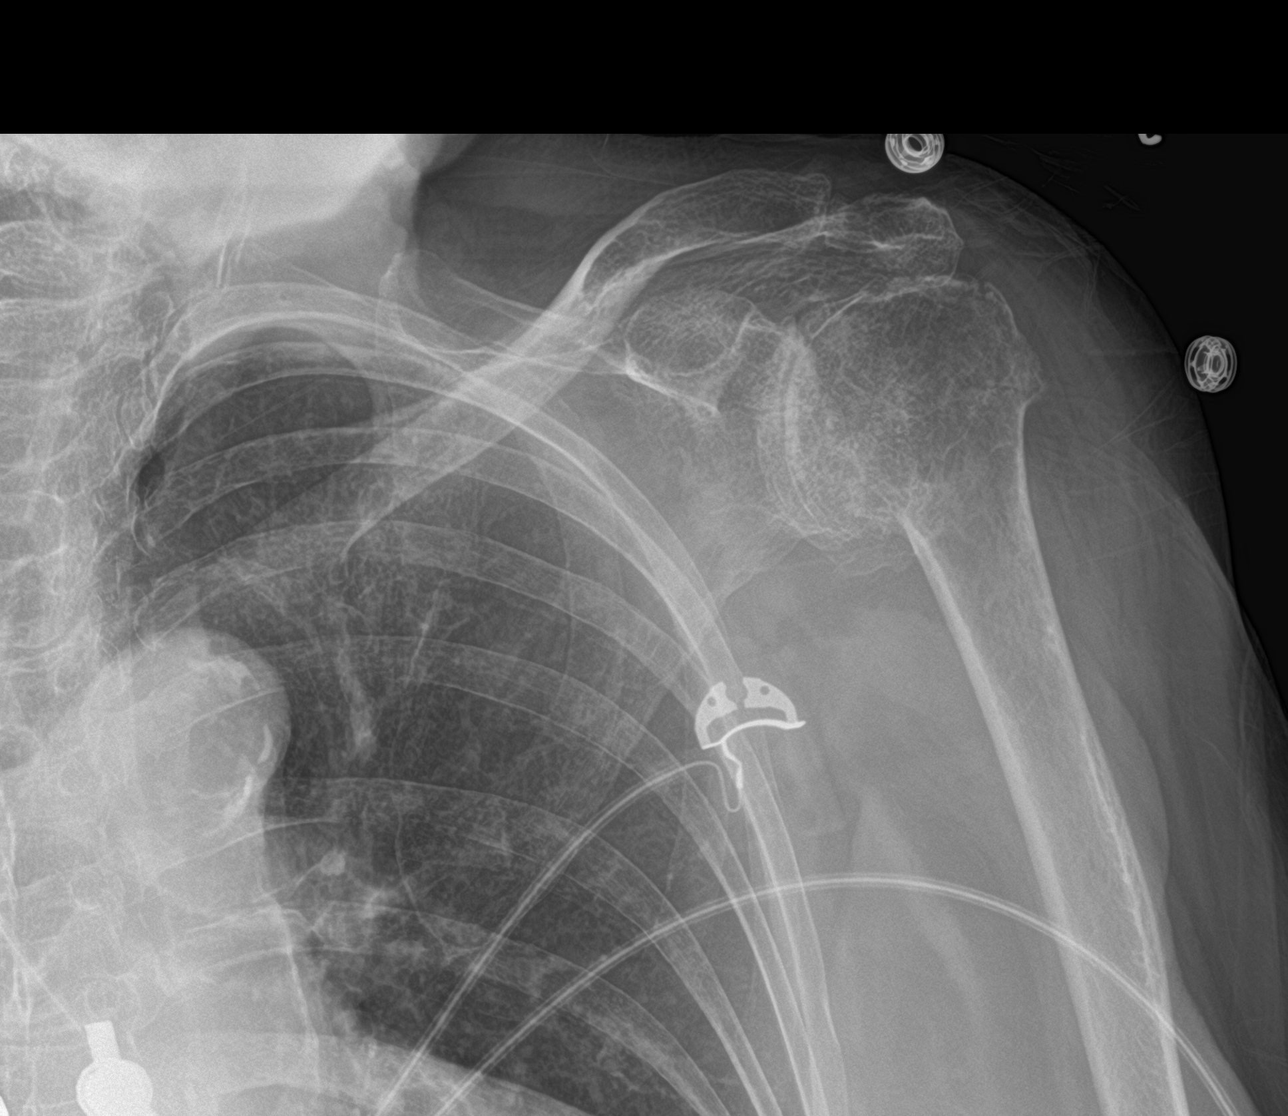

[Series 2: shoulder y view · 0.14mm/px · 2 of 2 slices shown]
[im 1/2]
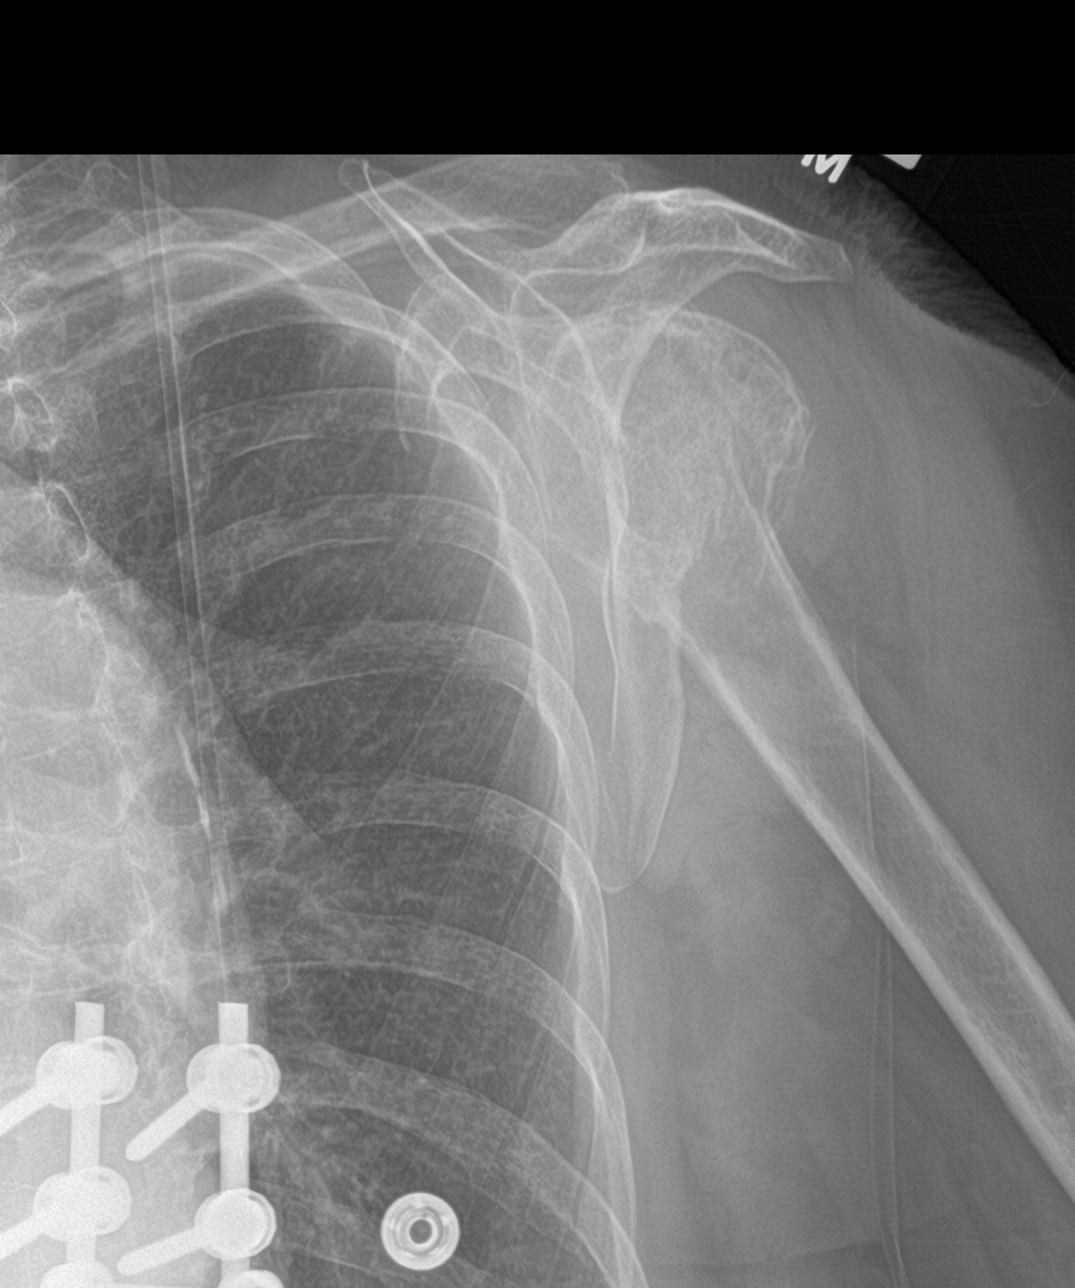
[im 2/2]
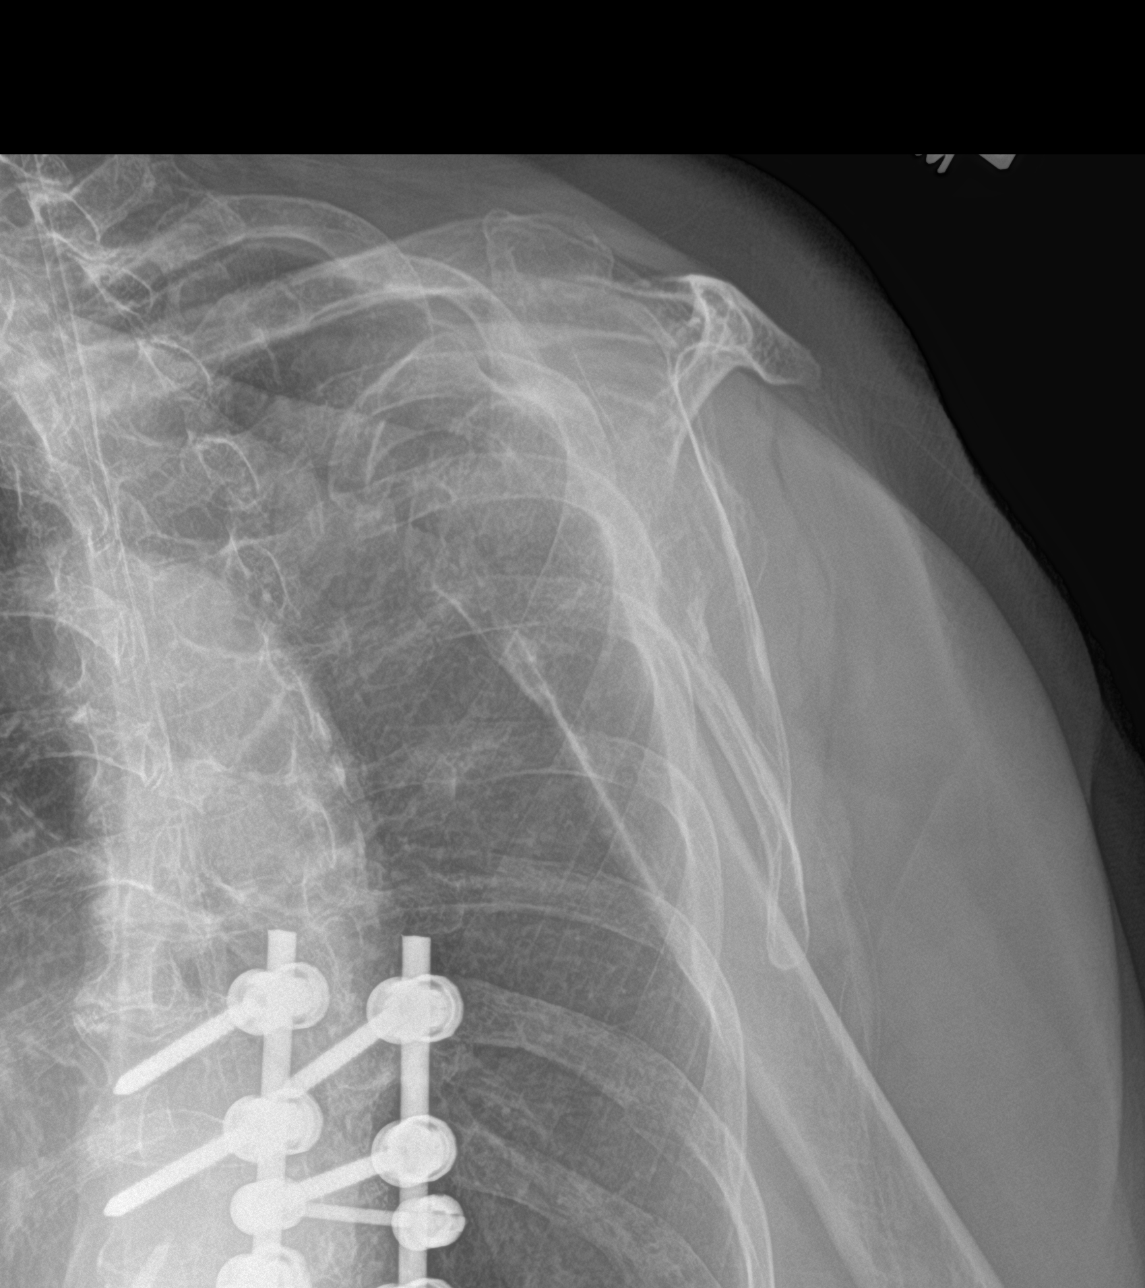

[3 of 3 positions shown; findings below may reference images not displayed]

FINDINGS: The bones are demineralized about left shoulder limiting assessment
for acute fractures. Remote appearing surgical neck impaction
fracture with healing is suggested about the humeral head-neck
junction with some callus formation on the scapular view noted.
Marked osteoarthritic joint space narrowing and subchondral
sclerosis with spurring is noted about the glenohumeral joint with
moderate osteoarthritis of the left AC joint. Old remote left fifth
lateral rib fracture. Atherosclerosis of the thoracic aorta.
Partially imaged thoracic spinal fusion hardware without
complicating features.
IMPRESSION: 1. Limited study due to osteopenic appearance of the bones.
2. There appears to be a remote surgical neck fracture with
impaction in the background of acromioclavicular and glenohumeral
joint osteoarthritis. If the patient has pain out of proportion to
radiographic findings, CT may help identify radiographically occult
fractures.
